# Patient Record
Sex: Male | Born: 1955
Health system: Southern US, Community
[De-identification: ages and names within clinical notes are randomized; demographics above are authoritative.]

## PROBLEM LIST (undated history)

## (undated) DIAGNOSIS — I10 Essential (primary) hypertension: Secondary | ICD-10-CM

## (undated) DIAGNOSIS — F419 Anxiety disorder, unspecified: Secondary | ICD-10-CM

## (undated) HISTORY — PX: FRACTURE SURGERY: SHX138

---

## 1999-06-15 ENCOUNTER — Emergency Department (HOSPITAL_COMMUNITY): Admission: EM | Admit: 1999-06-15 | Discharge: 1999-06-15 | Payer: Self-pay | Admitting: Emergency Medicine

## 2004-05-15 ENCOUNTER — Inpatient Hospital Stay (HOSPITAL_COMMUNITY): Admission: EM | Admit: 2004-05-15 | Discharge: 2004-05-19 | Payer: Self-pay | Admitting: Psychiatry

## 2004-05-21 ENCOUNTER — Emergency Department (HOSPITAL_COMMUNITY): Admission: EM | Admit: 2004-05-21 | Discharge: 2004-05-21 | Payer: Self-pay

## 2006-10-02 ENCOUNTER — Emergency Department (HOSPITAL_COMMUNITY): Admission: EM | Admit: 2006-10-02 | Discharge: 2006-10-03 | Payer: Self-pay | Admitting: Emergency Medicine

## 2015-03-16 ENCOUNTER — Telehealth (HOSPITAL_COMMUNITY): Payer: Self-pay

## 2017-02-17 NOTE — ED Provider Notes (Signed)
ED First Attending       History     Chief Complaint   Patient presents with    Medication Refill     Pt reports needing his HTN nad PSY meds. Pt does not know his meds. -SI/HI/CP/SOB.        History provided by:  patient  History limited by: nothing    HPI  60 HTN, EtOH abuse and homeless presenting w/ request for medication refill and shelter  Tried to go to shelter today, unable to get in 2/2 no beds  Homeless 1 week ago  Last drink tonight, never DTs, hosp or ICU  No other complaints including visual changes, SOB, chest pain, N/V, fevers, chills or any other concerns    Allergies/Contraindications  No Known Allergies    Previous Medications    No medications on file       No past medical history on file.    No past surgical history on file.    Social History     Social History    Marital status: N/A     Spouse name: N/A    Number of children: N/A    Years of education: N/A     Occupational History    Not on file.     Social History Main Topics    Smoking status: Not on file    Smokeless tobacco: Not on file    Alcohol use Not on file    Drug use: Unknown    Sexual activity: Not on file     Other Topics Concern    Not on file     Social History Narrative    No narrative on file         No family history on file.    History, Medications and Nursing Notes were reviewed by me  Review of Systems     Review of Systems   Constitutional: Negative for activity change, appetite change, chills, diaphoresis and fever.   HENT: Negative for congestion, rhinorrhea and trouble swallowing.    Eyes: Negative for photophobia, discharge and visual disturbance.   Respiratory: Negative for cough, choking, chest tightness, shortness of breath, wheezing and stridor.    Cardiovascular: Negative for chest pain and leg swelling.   Gastrointestinal: Negative for abdominal pain, anal bleeding, blood in stool, constipation, diarrhea, nausea and vomiting.   Endocrine: Negative for polyuria.   Genitourinary: Negative for  decreased urine volume, difficulty urinating, dysuria, flank pain, hematuria and urgency.   Skin: Negative for rash.   Neurological: Negative for dizziness, tremors, weakness, numbness and headaches.   Hematological: Negative.    Psychiatric/Behavioral: Negative.        Physical Exam   Triage Vital Signs:  BP: 152/76, Pulse - Palpated/Pleth: 89, Temp: 36.7 C (98.1 F), *Resp: 19, SpO2: 98 %    Physical Exam   Constitutional: He is oriented to person, place, and time. He appears well-developed and well-nourished. No distress.   HENT:   Head: Normocephalic.   Right Ear: External ear normal.   Left Ear: External ear normal.   Eyes: Conjunctivae and EOM are normal. Pupils are equal, round, and reactive to light. Right eye exhibits no discharge. Left eye exhibits no discharge. No scleral icterus.   Neck: Normal range of motion. Neck supple. No JVD present. No tracheal deviation present. No thyromegaly present.   Cardiovascular: Normal rate, regular rhythm, normal heart sounds and intact distal pulses.  Exam reveals no gallop and no friction rub.  No murmur heard.  Pulmonary/Chest: No stridor. No respiratory distress. He has no wheezes. He has no rales. He exhibits no tenderness.   Abdominal: Soft. Bowel sounds are normal. He exhibits no distension and no mass. There is no tenderness. There is no rebound and no guarding.   Musculoskeletal: Normal range of motion. He exhibits no edema.   Neurological: He is alert and oriented to person, place, and time. No cranial nerve deficit.   Skin: Skin is warm and dry. No rash noted. He is not diaphoretic. No erythema. No pallor.   Psychiatric: His behavior is normal.         Interpretations:  Lab, Imaging, EKG & Rhythm Strip       ED Course (Document differential diagnosis, ED treatment, response to treatment, reasons for choice of disposition, and whether further outpatient workup is needed or if patient is going to OR or ICU)      60 M27 pres for medication refill, food, and  shelter referral  No PCP, gets meds from emergency room, no acute symptoms  No signs of withdrawal     HOT referral, PCP coordination      Reassessment   12:56 AM  No space in shelter per HOT team      Coding and Billing Info     MDM               Estanislado Pandy, MD  Resident  02/18/17 1610       Charlies Constable, MD  02/18/17 1309

## 2018-02-20 ENCOUNTER — Emergency Department (HOSPITAL_COMMUNITY): Payer: Medicaid Other

## 2018-02-20 ENCOUNTER — Encounter (HOSPITAL_COMMUNITY): Payer: Self-pay | Admitting: *Deleted

## 2018-02-20 ENCOUNTER — Emergency Department (HOSPITAL_COMMUNITY)
Admission: EM | Admit: 2018-02-20 | Discharge: 2018-02-22 | Disposition: A | Discharge status: Home / Self Care | Payer: Medicaid Other | Attending: Emergency Medicine | Admitting: Emergency Medicine

## 2018-02-20 DIAGNOSIS — R45851 Suicidal ideations: Secondary | ICD-10-CM

## 2018-02-20 DIAGNOSIS — F331 Major depressive disorder, recurrent, moderate: Secondary | ICD-10-CM

## 2018-02-20 DIAGNOSIS — M79671 Pain in right foot: Secondary | ICD-10-CM | POA: Diagnosis present

## 2018-02-20 DIAGNOSIS — F17200 Nicotine dependence, unspecified, uncomplicated: Secondary | ICD-10-CM | POA: Insufficient documentation

## 2018-02-20 DIAGNOSIS — Z5900 Homelessness unspecified: Secondary | ICD-10-CM

## 2018-02-20 DIAGNOSIS — I10 Essential (primary) hypertension: Secondary | ICD-10-CM | POA: Diagnosis not present

## 2018-02-20 DIAGNOSIS — F419 Anxiety disorder, unspecified: Secondary | ICD-10-CM | POA: Diagnosis not present

## 2018-02-20 DIAGNOSIS — F17210 Nicotine dependence, cigarettes, uncomplicated: Secondary | ICD-10-CM | POA: Diagnosis not present

## 2018-02-20 DIAGNOSIS — F329 Major depressive disorder, single episode, unspecified: Secondary | ICD-10-CM | POA: Diagnosis not present

## 2018-02-20 DIAGNOSIS — Z01818 Encounter for other preprocedural examination: Secondary | ICD-10-CM

## 2018-02-20 DIAGNOSIS — M79673 Pain in unspecified foot: Secondary | ICD-10-CM | POA: Diagnosis not present

## 2018-02-20 HISTORY — DX: Essential (primary) hypertension: I10

## 2018-02-20 LAB — COMPREHENSIVE METABOLIC PANEL
ALBUMIN: 4.1 g/dL (ref 3.5–5.0)
ALT: 19 U/L (ref 17–63)
AST: 22 U/L (ref 15–41)
Alkaline Phosphatase: 77 U/L (ref 38–126)
Anion gap: 9 (ref 5–15)
BUN: 13 mg/dL (ref 6–20)
CO2: 26 mmol/L (ref 22–32)
CREATININE: 0.94 mg/dL (ref 0.61–1.24)
Calcium: 9.7 mg/dL (ref 8.9–10.3)
Chloride: 104 mmol/L (ref 101–111)
GFR calc Af Amer: >60 mL/min (ref >60)
GFR calc non Af Amer: >60 mL/min (ref >60)
GLUCOSE: 124 mg/dL — AB (ref 65–99)
Potassium: 4.5 mmol/L (ref 3.5–5.1)
SODIUM: 139 mmol/L (ref 135–145)
Total Bilirubin: 1 mg/dL (ref 0.3–1.2)
Total Protein: 8.2 g/dL — ABNORMAL HIGH (ref 6.5–8.1)

## 2018-02-20 LAB — CBC WITH DIFFERENTIAL/PLATELET
BASOS PCT: 0 %
Basophils Absolute: 0 10*3/uL (ref 0.0–0.1)
EOS PCT: 1 %
Eosinophils Absolute: 0.1 10*3/uL (ref 0.0–0.7)
HCT: 41 % (ref 39.0–52.0)
HEMOGLOBIN: 14.3 g/dL (ref 13.0–17.0)
Lymphocytes Relative: 25 %
Lymphs Abs: 2 10*3/uL (ref 0.7–4.0)
MCH: 32 pg (ref 26.0–34.0)
MCHC: 34.9 g/dL (ref 30.0–36.0)
MCV: 91.7 fL (ref 78.0–100.0)
Monocytes Absolute: 0.7 10*3/uL (ref 0.1–1.0)
Monocytes Relative: 9 %
NEUTROS PCT: 65 %
Neutro Abs: 5.1 10*3/uL (ref 1.7–7.7)
PLATELETS: 240 10*3/uL (ref 150–400)
RBC: 4.47 MIL/uL (ref 4.22–5.81)
RDW: 13.1 % (ref 11.5–15.5)
WBC: 7.9 10*3/uL (ref 4.0–10.5)

## 2018-02-20 LAB — RAPID URINE DRUG SCREEN, HOSP PERFORMED
AMPHETAMINES: NOT DETECTED
BARBITURATES: NOT DETECTED
BENZODIAZEPINES: NOT DETECTED
Cocaine: NOT DETECTED
Opiates: NOT DETECTED
TETRAHYDROCANNABINOL: NOT DETECTED

## 2018-02-20 LAB — ETHANOL: Alcohol, Ethyl (B): <10 mg/dL (ref <10)

## 2018-02-20 LAB — ACETAMINOPHEN LEVEL: Acetaminophen (Tylenol), Serum: <10 ug/mL — ABNORMAL LOW (ref 10–30)

## 2018-02-20 LAB — SALICYLATE LEVEL

## 2018-02-20 MED ORDER — LORAZEPAM 2 MG/ML IJ SOLN: 0.0000 mg | Freq: Two times a day (BID) | INTRAMUSCULAR | Status: DC

## 2018-02-20 MED ORDER — LORAZEPAM 1 MG PO TABS: 0.0000 mg | ORAL_TABLET | Freq: Four times a day (QID) | ORAL | Status: DC

## 2018-02-20 MED ORDER — IBUPROFEN 200 MG PO TABS
600.0000 mg | ORAL_TABLET | Freq: Three times a day (TID) | ORAL | Status: DC | PRN
  Administered 2018-02-21 – 2018-02-22 (×2): 600 mg via ORAL
  Filled 2018-02-20 (×2): qty 3

## 2018-02-20 MED ORDER — THIAMINE HCL 100 MG/ML IJ SOLN: 100.0000 mg | Freq: Every day | INTRAMUSCULAR | Status: DC

## 2018-02-20 MED ORDER — VITAMIN B-1 100 MG PO TABS
100.0000 mg | ORAL_TABLET | Freq: Every day | ORAL | Status: DC
  Administered 2018-02-20: 100 mg via ORAL
  Filled 2018-02-20: qty 1

## 2018-02-20 MED ORDER — LORAZEPAM 1 MG PO TABS: 0.0000 mg | ORAL_TABLET | Freq: Two times a day (BID) | ORAL | Status: DC

## 2018-02-20 MED ORDER — LORAZEPAM 2 MG/ML IJ SOLN: 0.0000 mg | Freq: Four times a day (QID) | INTRAMUSCULAR | Status: DC

## 2018-02-20 NOTE — ED Notes (Signed)
Bed: WA30 Expected date:  Expected time:  Means of arrival:  Comments: 

## 2018-02-20 NOTE — ED Notes (Signed)
Pt had the following items locked with security:  Wallet with ID cards and bank cards $14 cash Black cell phone (flip phone) Cell phone charger  Key is attached paper and place in pt's chart.

## 2018-02-20 NOTE — ED Notes (Signed)
Pt currently take a shower.

## 2018-02-20 NOTE — ED Provider Notes (Signed)
[facility]-EMERGENCY DEPT Provider Note   CSN: [id number] Arrival date & time: 02/20/18  1341     History   Chief Complaint Chief Complaint  Patient presents with  . Foot Pain  . Homeless    HPI Allen Travis is a 62 y.o. male who presents today for evaluation of foot pain and homelessness.  Patient is a poor historian.  He initially reported to me that his foot was operated on about 1 month ago after he stepped off a curb.  He then later told me that it was actually 3 months ago and that his foot has been hurting more for the past few days.  He reports that his surgery was done in [place], however he does not know where, which hospital, or which doctor.  He reports that he came from [place] to [place] approximately 3 days ago.  He reports that he is unable to walk on his foot and that he needs somewhere to stay as he cannot go back onto the streets tonight.  He denies any fevers or chills.  Denies any other physical complaints.  After I told patient that he would not be admitted for his foot pain and would be given a resource guide with shelters he then told me that he was suicidal and homicidal.  He does not have any specific plans for either of these.  When I asked him what diagnoses he has been given in the past he told me stress.    HPI  Past Medical History:  Diagnosis Date  . Hypertension     There are no active problems to display for this patient.   Past Surgical History:  Procedure Laterality Date  . FRACTURE SURGERY          Home Medications    Prior to Admission medications   Not on File    Family History No family history on file.  Social History Social History   Tobacco Use  . Smoking status: Current Some Day Smoker  . Smokeless tobacco: Never Used  Substance Use Topics  . Alcohol use: Yes  . Drug use: Not on file     Allergies   Patient has no allergy information on record.   Review of Systems Review of  Systems  Constitutional: Negative for chills and fever.  Skin: Negative for color change and wound.  Neurological: Negative for weakness.  Psychiatric/Behavioral: Positive for suicidal ideas.  All other systems reviewed and are negative.    Physical Exam Updated Vital Signs BP (!) 149/89 (BP Location: Right Arm)   Pulse 81   Temp 98.1 F (36.7 C) (Oral)   Resp 17   SpO2 96%   Physical Exam  Constitutional: Vital signs are normal. He does not appear ill. No distress.  Patient is disheveled, walking boot on right ankle is wet and malodorous, he has on multiple layers of socks all of which are also wet and malodorous.  He generally appears unkempt.  HENT:  Head: Normocephalic and atraumatic.  Eyes: Conjunctivae are normal. Right eye exhibits no discharge. Left eye exhibits no discharge. No scleral icterus.  Neck: Normal range of motion.  Cardiovascular: Normal rate and regular rhythm.  Pulmonary/Chest: Effort normal. No stridor. No respiratory distress.  Abdominal: He exhibits no distension.  Musculoskeletal: He exhibits no edema or deformity.  Right foot is warm and well perfused with intact sensory function.  Areas were patient reports his incision were appear very well-healed.  Toenails are dystrophic, however there  is no obvious signs of superficial infection.  Neurological: He is alert. He exhibits normal muscle tone.  Skin: Skin is warm and dry.  Psychiatric: He has a normal mood and affect. His behavior is normal. He expresses homicidal and suicidal ideation. He expresses no suicidal plans and no homicidal plans.  Nursing note and vitals reviewed.    ED Treatments / Results  Labs (all labs ordered are listed, but only abnormal results are displayed) Labs Reviewed  COMPREHENSIVE METABOLIC PANEL - Abnormal; Notable for the following components:      Result Value   Glucose, Bld 124 (*)    Total Protein 8.2 (*)    All other components within normal limits  ACETAMINOPHEN  LEVEL - Abnormal; Notable for the following components:   Acetaminophen (Tylenol), Serum <10 (*)    All other components within normal limits  ETHANOL  RAPID URINE DRUG SCREEN, HOSP PERFORMED  CBC WITH DIFFERENTIAL/PLATELET  SALICYLATE LEVEL    EKG None  Radiology Dg Tibia/fibula Right  Result Date: 02/20/2018 CLINICAL DATA:  Patient is unable to walk since breaking his foot 3 months ago. Patient is still unable to bear weight on the foot. Pain is mostly in the right foot and ankle. EXAM: RIGHT TIBIA AND FIBULA - 2 VIEW COMPARISON:  None. FINDINGS: Medial femorotibial joint space narrowing of the included knee. Lateral plate and screw fixation across distal fibular diaphyseal fracture with incomplete osseous union noted along the posterior aspect of the distal fibula. Two syndesmotic screws are in place across the tibiofibular syndesmosis. Soft tissue coarsened ossifications are present along the expected course of the Achilles. No fracture of the hardware nor definite loosening. Intact ankle mortise. Intact subtalar joint. Small ankle joint effusion. Calcaneal enthesopathy is identified along plantar dorsal aspect. IMPRESSION: 1. 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 2. Coarsened soft tissue calcifications along the course of the expected Achilles tendon likely reflecting remote injury to the Achilles. 3. No hardware failure. 4. Small ankle joint effusion. 5. Small plantar and dorsal calcaneal spurs. 6. No new fracture or frank bone destruction. 7. Osteoarthritis of the knee. Electronically Signed   By: Tollie Eth M.D.   On: 02/20/2018 18:45   Dg Foot Complete Right  Result Date: 02/20/2018 CLINICAL DATA:  Persistent right foot pain following a fracture 3 months ago. Unable to bear weight. Initial encounter. EXAM: RIGHT FOOT COMPLETE - 3+ VIEW COMPARISON:  None. FINDINGS: Sequelae of plate and screw fixation of a distal fibular fracture are partially  visualized. Screws also extend across the distal tibiofibular syndesmosis. There is heterotopic ossification posterior to the ankle. The bones of the foot are osteopenic without acute fracture or dislocation identified. Mild degenerative changes are noted in multiple IP joints. A small plantar calcaneal enthesophyte is present. There is mild soft tissue swelling along the dorsum of the foot. IMPRESSION: 1. No acute osseous abnormality identified in the foot. 2. Prior ORIF of a distal fibular fracture, incompletely evaluated. Electronically Signed   By: Sebastian Ache M.D.   On: 02/20/2018 18:01    Procedures Procedures (including critical care time)  Medications Ordered in ED Medications  LORazepam (ATIVAN) injection 0-4 mg (has no administration in time range)    Or  LORazepam (ATIVAN) tablet 0-4 mg (has no administration in time range)  LORazepam (ATIVAN) injection 0-4 mg (has no administration in time range)    Or  LORazepam (ATIVAN) tablet 0-4 mg (has no administration in time range)  thiamine (  VITAMIN B-1) tablet 100 mg (has no administration in time range)    Or  thiamine (B-1) injection 100 mg (has no administration in time range)  ibuprofen (ADVIL,MOTRIN) tablet 600 mg (has no administration in time range)     Initial Impression / Assessment and Plan / ED Course  I have reviewed the triage vital signs and the nursing notes.  Pertinent labs & imaging results that were available during my care of the patient were reviewed by me and considered in my medical decision making (see chart for details).  Clinical Course as of Feb 20 2026  Tue Feb 20, 2018  1620 Attempted to see patient, is in bathroom.    [EH]  1850 Was informed by x-ray that patient would like to speak with mental health regarding his depression.  I asked the patient what he has been diagnosed with in the past and he reports stress.  He denies any other medical or psychiatric diagnoses.  He states that he gets [employer]  Security payments.  Patient reports that he is suicidal, does not have a plan, when I asked him if he sees things that other people do not see he reports "yes I have all of that."  He reports generally being homicidal.  When I asked patient where he would be going tonight if he is not here overnight he reports that he would most likely be going back to the streets as he does not have anywhere to go tonight.  Reports that he is out of his psychiatric medications, however is unable to tell me what they are.   [EH]  2019 Patient medically clear   [EH]    Clinical Course User Index [EH] Cristina GongHammond, Octavia Velador W, PA-C   Patient presents today for evaluation of right foot pain.  He reports that he came here from [place] about 3 days ago and has been homeless since then.  He is a poor historian and changes his story about when his surgery happened, does not know where it happened or who performed his surgery.  He denies any past medical history other than stress.  X-rays were obtained and reviewed.  Studies do not show evidence of hardware failure, they do show evidence of a distal fibular nonunion.  Patient can be weightbearing on this in a boot.  Out patient ortho follow up.  When I informed patient that he would be discharged and given a list of shelters he then stated that he was suicidal and homicidal.  He does not have specific plans for either of these.  TTS was consulted.  Medical clearance labs were obtained no acute abnormalities.  Patient is medically clear for psychiatric placement and disposition.  Final Clinical Impressions(s) / ED Diagnoses   Final diagnoses:  Homeless  Suicidal ideation  Right foot pain    ED Discharge Orders    None       Norman ClayHammond, Sherlon Nied W, PA-C 02/20/18 2028    Jacalyn LefevreHaviland, Julie, MD 02/20/18 2200

## 2018-02-20 NOTE — ED Triage Notes (Signed)
Pt states he is unable to walk since breaking his foot 3 months ago. Pt had surgery and went to physical therapy but still cannot bear weight on his foot. Pt states he is homeless and does not have any resources to help take care of himself.

## 2018-02-20 NOTE — BH Assessment (Addendum)
Assessment Note  Allen Travis is an 62 y.o. male.  The pt came in due to medical reasons and when he was about to be discharged he told an RN he was suicidal, homicidal and was hearing and seeing things.  The pt now says he is not homicidal.  He stated he has passing suicidal thoughts and stated he last had suicidal thoughts "a few hours ago".  He denies having had a plan.  He reports he has had about 10 attempts in the past.  The pt stated he never overdosed on medication and was not able to state what he normally did to try to kill himself in the past.  He stated he was hearing voices earlier today saying to "jump on people".  He denies any visual hallucinations.  The pt is not a good historian and was not able to answer many questions such as his last hospitalizations, previous medications, or most recent counselors or psychiatrist.  The pt mentioned 7 members of his family died in a house fire in [id number].  He stated he still thinks about his deceased family members.  The pt reported he is not sleeping or eating well.  He denied SA and his UDS is negative for all substances.    Diagnosis: F25.1 Schizoaffective disorder, Depressive type   Past Medical History:  Past Medical History:  Diagnosis Date  . Hypertension     Past Surgical History:  Procedure Laterality Date  . FRACTURE SURGERY      Family History: No family history on file.  Social History:  reports that he has been smoking.  He has never used smokeless tobacco. He reports that he drinks alcohol. His drug history is not on file.  Additional Social History:  Alcohol / Drug Use Pain Medications: See MAR Prescriptions: See MAR Over the Counter: See MAr History of alcohol / drug use?: No history of alcohol / drug abuse Longest period of sobriety (when/how long): NA  CIWA: CIWA-Ar BP: (!) 148/88 Pulse Rate: 94 Nausea and Vomiting: no nausea and no vomiting Tactile Disturbances: none Tremor: no tremor Auditory  Disturbances: very mild harshness or ability to frighten Paroxysmal Sweats: no sweat visible Visual Disturbances: not present Anxiety: no anxiety, at ease Headache, Fullness in Head: none present Agitation: normal activity Orientation and Clouding of Sensorium: oriented and can do serial additions CIWA-Ar Total: 1 COWS:    Allergies: No Known Allergies  Home Medications:  (Not in a hospital admission)  OB/GYN Status:  No LMP for male patient.  General Assessment Data Location of Assessment: [facility] TTS Assessment: In system Is this a Tele or Face-to-Face Assessment?: Face-to-Face Is this an Initial Assessment or a Re-assessment for this encounter?: Initial Assessment Marital status: Single Maiden name: NA Is patient pregnant?: Other (Comment)(Male) Living Arrangements: Alone, Other (Comment)(homeless) Can pt return to current living arrangement?: Yes Admission Status: Voluntary Is patient capable of signing voluntary admission?: Yes Referral Source: Self/Family/Friend Insurance type: Medicaid     Crisis Care Plan Living Arrangements: Alone, Other (Comment)(homeless) Legal Guardian: Other:(Self) Name of Psychiatrist: none Name of Therapist: none  Education Status Is patient currently in school?: No Is the patient employed, unemployed or receiving disability?: Unemployed  Risk to self with the past 6 months Suicidal Ideation: No-Not Currently/Within Last 6 Months Has patient been a risk to self within the past 6 months prior to admission? : No Suicidal Intent: No-Not Currently/Within Last 6 Months Has patient had any suicidal intent within the past 6 months prior  to admission? : No Is patient at risk for suicide?: Yes Suicidal Plan?: No Has patient had any suicidal plan within the past 6 months prior to admission? : No Access to Means: No What has been your use of drugs/alcohol within the last 12 months?: none Previous Attempts/Gestures: Yes How many times?:  10 Other Self Harm Risks: cutting Triggers for Past Attempts: Unpredictable Intentional Self Injurious Behavior: Cutting Comment - Self Injurious Behavior: history of cutting Family Suicide History: No Recent stressful life event(s): Other (Comment), Recent negative physical changes(homeless and broke foot) Persecutory voices/beliefs?: No Depression: Yes Depression Symptoms: Feeling worthless/self pity Substance abuse history and/or treatment for substance abuse?: No Suicide prevention information given to non-admitted patients: Yes  Risk to Others within the past 6 months Homicidal Ideation: No Does patient have any lifetime risk of violence toward others beyond the six months prior to admission? : No Thoughts of Harm to Others: No Current Homicidal Intent: No Current Homicidal Plan: No Access to Homicidal Means: No Identified Victim: none History of harm to others?: No Assessment of Violence: None Noted Violent Behavior Description: none Does patient have access to weapons?: No Criminal Charges Pending?: No Does patient have a court date: No Is patient on probation?: No  Psychosis Hallucinations: Auditory, Visual Delusions: None noted  Mental Status Report Appearance/Hygiene: In scrubs, Unremarkable Eye Contact: Good Motor Activity: Unable to assess Speech: Logical/coherent Level of Consciousness: Alert Mood: Pleasant Affect: Appropriate to circumstance Anxiety Level: None Thought Processes: Coherent, Relevant Judgement: Partial Orientation: Person, Place, Time, Situation, Appropriate for developmental age Obsessive Compulsive Thoughts/Behaviors: None  Cognitive Functioning Concentration: Normal Memory: Recent Intact, Remote Intact Is patient IDD: No Is patient DD?: No Insight: Fair Impulse Control: Fair Appetite: Good Have you had any weight changes? : Loss Amount of the weight change? (lbs): 0 lbs Sleep: Decreased Total Hours of Sleep: 5 Vegetative  Symptoms: None  ADLScreening [facility] Assessment Services) Patient's cognitive ability adequate to safely complete daily activities?: Yes Patient able to express need for assistance with ADLs?: Yes Independently performs ADLs?: Yes (appropriate for developmental age)  Prior Inpatient Therapy Prior Inpatient Therapy: Yes Prior Therapy Dates: multiple, most recent 2012 Prior Therapy Facilty/Provider(s): "place in [place]" Reason for Treatment: SI  Prior Outpatient Therapy Prior Outpatient Therapy: Yes Prior Therapy Dates: 2018 Prior Therapy Facilty/Provider(s): pt doesn't remember Reason for Treatment: depression Does patient have an ACCT team?: No Does patient have Intensive In-House Services?  : No Does patient have [facility] services? : No Does patient have P4CC services?: No  ADL Screening (condition at time of admission) Patient's cognitive ability adequate to safely complete daily activities?: Yes Patient able to express need for assistance with ADLs?: Yes Independently performs ADLs?: Yes (appropriate for developmental age)       Abuse/Neglect Assessment (Assessment to be complete while patient is alone) Abuse/Neglect Assessment Can Be Completed: Yes Physical Abuse: Denies Verbal Abuse: Denies Sexual Abuse: Denies Exploitation of patient/patient's resources: Denies Self-Neglect: Denies Values / Beliefs Cultural Requests During Hospitalization: None Spiritual Requests During Hospitalization: None Consults Spiritual Care Consult Needed: No Social Work Consult Needed: No            Disposition:  Disposition Initial Assessment Completed for this Encounter: Yes   PA Donell Sievert recommends the pt be observed overnight for safety and stabilization and then reassessed in the AM.  RN Christeen Douglas was made aware of the recommendation.  On Site Evaluation by:   Reviewed with Physician:    Ottis Stain 02/20/2018 11:07 PM

## 2018-02-21 ENCOUNTER — Emergency Department (HOSPITAL_COMMUNITY): Payer: Medicaid Other

## 2018-02-21 DIAGNOSIS — Z736 Limitation of activities due to disability: Secondary | ICD-10-CM

## 2018-02-21 DIAGNOSIS — R440 Auditory hallucinations: Secondary | ICD-10-CM

## 2018-02-21 DIAGNOSIS — R45851 Suicidal ideations: Secondary | ICD-10-CM | POA: Diagnosis not present

## 2018-02-21 DIAGNOSIS — F17200 Nicotine dependence, unspecified, uncomplicated: Secondary | ICD-10-CM

## 2018-02-21 DIAGNOSIS — F329 Major depressive disorder, single episode, unspecified: Secondary | ICD-10-CM | POA: Diagnosis not present

## 2018-02-21 DIAGNOSIS — F419 Anxiety disorder, unspecified: Secondary | ICD-10-CM | POA: Diagnosis not present

## 2018-02-21 DIAGNOSIS — Z590 Homelessness: Secondary | ICD-10-CM

## 2018-02-21 DIAGNOSIS — R4587 Impulsiveness: Secondary | ICD-10-CM

## 2018-02-21 DIAGNOSIS — M79673 Pain in unspecified foot: Secondary | ICD-10-CM

## 2018-02-21 LAB — URINALYSIS, ROUTINE W REFLEX MICROSCOPIC
BILIRUBIN URINE: NEGATIVE
Bacteria, UA: NONE SEEN
GLUCOSE, UA: NEGATIVE mg/dL
KETONES UR: NEGATIVE mg/dL
LEUKOCYTES UA: NEGATIVE
NITRITE: NEGATIVE
PROTEIN: NEGATIVE mg/dL
Specific Gravity, Urine: 1.012 (ref 1.005–1.030)
pH: 5 (ref 5.0–8.0)

## 2018-02-21 NOTE — ED Notes (Signed)
Psych Provider at bedside. 

## 2018-02-21 NOTE — Care Management Note (Signed)
Case Management Note  CM contacted for no pcp and homelessness with need for follow up.  CM advised Allen Travis, [place] [facility]avinia SharpsMary Ann Placey, NP.  She can assist him with establishing her as his pcp, medications, specialty follow up, mental health resources, and homelessness resources.  CM will send a note to Chales AbrahamsMary Ann to be aware of pt's needs.  No further CM needs noted at this time.

## 2018-02-21 NOTE — Evaluation (Addendum)
Physical Therapy Evaluation Patient Details Name: Allen Travis MRN: [id number] DOB: 1956/01/29 Today's Date: 02/21/2018   History of Present Illness  62 yo male admitted with suicidal ideation, R foot pain. Hx of R foot surgery 2*fx per pt ~1 month prior to admission  Clinical Impression  On eval, pt was supervision level assist with mobility. He walked ~75 feet with a RW. Pain rated 8/10 with activity. At baseline, pt is able to ambulate with a straight cane with Modified Independence. Unsure of d/c plan at this time. Pt stated he is homeless (living in [place] since moving from [place]). Will continue to follow, progress activity, and assess DME needs. Unsure of last time pt followed up with orthopedic MD since surgery.     Follow Up Recommendations SNF (vs inpatient psych hospital-per chart)    Equipment Recommendations  (continuing to assess)    Recommendations for Other Services       Precautions / Restrictions Precautions Precautions: Fall Required Braces or Orthoses: Other Brace/Splint Other Brace/Splint: CAM boot Restrictions Other Position/Activity Restrictions: per pt, he is WBAT with CAM boot      Mobility  Bed Mobility Overal bed mobility: Modified Independent                Transfers Overall transfer level: Needs assistance Equipment used: Rolling walker (2 wheeled) Transfers: Sit to/from Stand Sit to Stand: Supervision         General transfer comment: for safety.   Ambulation/Gait Ambulation/Gait assistance: Supervision Ambulation Distance (Feet): 75 Feet Assistive device: Rolling walker (2 wheeled) Gait Pattern/deviations: Step-to pattern;Step-through pattern;Decreased stride length     General Gait Details: for safety. No LOB with RW use.   Stairs            Wheelchair Mobility    Modified Rankin (Stroke Patients Only)       Balance Overall balance assessment: Needs assistance           Standing balance-Leahy Scale:  Fair Standing balance comment: pt able to statically stand without LOB                             Pertinent Vitals/Pain Pain Assessment: 0-10 Pain Score: 8  Pain Location: R foot with activity Pain Descriptors / Indicators: Sore Pain Intervention(s): Monitored during session;Repositioned    Home Living Family/patient expects to be discharged to:: Shelter/Homeless                 Additional Comments: pt stated he has been living in motels since moving from [place]    Prior Function Level of Independence: Independent with assistive device(s)         Comments: ambulatory with Cam boot and cane. Pt has lost his cane     Hand Dominance        Extremity/Trunk Assessment   Upper Extremity Assessment Upper Extremity Assessment: Overall WFL for tasks assessed    Lower Extremity Assessment Lower Extremity Assessment: Generalized weakness    Cervical / Trunk Assessment Cervical / Trunk Assessment: Normal  Communication   Communication: No difficulties  Cognition Arousal/Alertness: Awake/alert Behavior During Therapy: WFL for tasks assessed/performed Overall Cognitive Status: Within Functional Limits for tasks assessed                                        General Comments  Exercises     Assessment/Plan    PT Assessment Patient needs continued PT services  PT Problem List Decreased mobility;Pain;Decreased knowledge of use of DME       PT Treatment Interventions Gait training;Therapeutic activities;Therapeutic exercise;Patient/family education;Balance training;Functional mobility training;DME instruction    PT Goals (Current goals can be found in the Care Plan section)  Acute Rehab PT Goals Patient Stated Goal: regain independence with mobility PT Goal Formulation: With patient Time For Goal Achievement: 03/07/18 Potential to Achieve Goals: Good    Frequency Min 2X/week   Barriers to discharge         Co-evaluation               AM-PAC PT "6 Clicks" Daily Activity  Outcome Measure Difficulty turning over in bed (including adjusting bedclothes, sheets and blankets)?: None Difficulty moving from lying on back to sitting on the side of the bed? : None Difficulty sitting down on and standing up from a chair with arms (e.g., wheelchair, bedside commode, etc,.)?: None Help needed moving to and from a bed to chair (including a wheelchair)?: A Little Help needed walking in hospital room?: A Little Help needed climbing 3-5 steps with a railing? : A Little 6 Click Score: 21    End of Session Equipment Utilized During Treatment: Gait belt;Other (comment)(CAM boot) Activity Tolerance: Patient tolerated treatment well Patient left: in bed;with call bell/phone within reach   PT Visit Diagnosis: Other abnormalities of gait and mobility (R26.89);Pain Pain - Right/Left: Right Pain - part of body: Ankle and joints of foot    Time: [id number] PT Time Calculation (min) (ACUTE ONLY): 11 min   Charges:   PT Evaluation $PT Eval Moderate Complexity: 1 Mod     PT G Codes:          Allen Travis, MPT Pager: [phone]

## 2018-02-21 NOTE — BH Assessment (Signed)
[facility]  Per Juanetta BeetsJacqueline Norman, DO, this pt requires psychiatric hospitalization at this time.  The following facilities have been contacted to seek placement for this pt, with results as noted:  Beds available, information sent, decision pending:  [facility]   At capacity:  [facility]   Kateri Balch, [place] Behavioral Health Coordinator [phone]
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
## 2018-02-22 ENCOUNTER — Other Ambulatory Visit: Payer: Self-pay

## 2018-02-22 ENCOUNTER — Inpatient Hospital Stay
Admission: AD | Admit: 2018-02-22 | Discharge: 2018-03-01 | DRG: 885 | Disposition: A | Discharge status: Home / Self Care | Payer: BLUE CROSS/BLUE SHIELD | Source: Intra-hospital | Attending: Psychiatry | Admitting: Psychiatry

## 2018-02-22 ENCOUNTER — Encounter: Payer: Self-pay | Admitting: Psychiatry

## 2018-02-22 DIAGNOSIS — M79671 Pain in right foot: Secondary | ICD-10-CM | POA: Diagnosis not present

## 2018-02-22 DIAGNOSIS — Z716 Tobacco abuse counseling: Secondary | ICD-10-CM | POA: Diagnosis not present

## 2018-02-22 DIAGNOSIS — F332 Major depressive disorder, recurrent severe without psychotic features: Principal | ICD-10-CM | POA: Diagnosis present

## 2018-02-22 DIAGNOSIS — G4700 Insomnia, unspecified: Secondary | ICD-10-CM | POA: Diagnosis present

## 2018-02-22 DIAGNOSIS — Z590 Homelessness: Secondary | ICD-10-CM | POA: Diagnosis not present

## 2018-02-22 DIAGNOSIS — F1020 Alcohol dependence, uncomplicated: Secondary | ICD-10-CM | POA: Diagnosis present

## 2018-02-22 DIAGNOSIS — F17210 Nicotine dependence, cigarettes, uncomplicated: Secondary | ICD-10-CM

## 2018-02-22 DIAGNOSIS — Z818 Family history of other mental and behavioral disorders: Secondary | ICD-10-CM | POA: Diagnosis not present

## 2018-02-22 DIAGNOSIS — F1290 Cannabis use, unspecified, uncomplicated: Secondary | ICD-10-CM | POA: Diagnosis present

## 2018-02-22 DIAGNOSIS — Z9119 Patient's noncompliance with other medical treatment and regimen: Secondary | ICD-10-CM | POA: Diagnosis not present

## 2018-02-22 DIAGNOSIS — F4310 Post-traumatic stress disorder, unspecified: Secondary | ICD-10-CM | POA: Diagnosis present

## 2018-02-22 DIAGNOSIS — Z915 Personal history of self-harm: Secondary | ICD-10-CM

## 2018-02-22 DIAGNOSIS — S82892S Other fracture of left lower leg, sequela: Secondary | ICD-10-CM

## 2018-02-22 DIAGNOSIS — F331 Major depressive disorder, recurrent, moderate: Secondary | ICD-10-CM | POA: Diagnosis not present

## 2018-02-22 DIAGNOSIS — I10 Essential (primary) hypertension: Secondary | ICD-10-CM | POA: Diagnosis present

## 2018-02-22 DIAGNOSIS — R45851 Suicidal ideations: Secondary | ICD-10-CM | POA: Diagnosis present

## 2018-02-22 MED ORDER — ALUM & MAG HYDROXIDE-SIMETH 200-200-20 MG/5ML PO SUSP: 30.0000 mL | ORAL | Status: DC | PRN

## 2018-02-22 MED ORDER — ACETAMINOPHEN 325 MG PO TABS
650.0000 mg | ORAL_TABLET | Freq: Four times a day (QID) | ORAL | Status: DC | PRN
  Administered 2018-02-24 – 2018-02-28 (×3): 650 mg via ORAL
  Filled 2018-02-22 (×3): qty 2

## 2018-02-22 MED ORDER — ACETAMINOPHEN 325 MG PO TABS: 650.0000 mg | ORAL_TABLET | Freq: Four times a day (QID) | ORAL | Status: DC | PRN

## 2018-02-22 MED ORDER — MAGNESIUM HYDROXIDE 400 MG/5ML PO SUSP: 30.0000 mL | Freq: Every day | ORAL | Status: DC | PRN

## 2018-02-22 MED ORDER — HYDROXYZINE HCL 50 MG PO TABS: 50.0000 mg | ORAL_TABLET | Freq: Three times a day (TID) | ORAL | Status: DC | PRN

## 2018-02-22 MED ORDER — CITALOPRAM HYDROBROMIDE 20 MG PO TABS
10.0000 mg | ORAL_TABLET | Freq: Every day | ORAL | Status: DC
  Administered 2018-02-23 – 2018-02-24 (×2): 10 mg via ORAL
  Filled 2018-02-22 (×2): qty 1

## 2018-02-22 MED ORDER — IBUPROFEN 600 MG PO TABS: 600.0000 mg | ORAL_TABLET | Freq: Three times a day (TID) | ORAL | Status: DC | PRN

## 2018-02-22 MED ORDER — CITALOPRAM HYDROBROMIDE 10 MG PO TABS: 10.0000 mg | ORAL_TABLET | Freq: Every day | ORAL | Status: DC

## 2018-02-22 MED ORDER — IBUPROFEN 600 MG PO TABS
600.0000 mg | ORAL_TABLET | Freq: Four times a day (QID) | ORAL | Status: DC | PRN
  Administered 2018-02-22 – 2018-02-27 (×4): 600 mg via ORAL
  Filled 2018-02-22 (×4): qty 1

## 2018-02-22 MED ORDER — TRAZODONE HCL 100 MG PO TABS
100.0000 mg | ORAL_TABLET | Freq: Every evening | ORAL | Status: DC | PRN
  Administered 2018-02-26 – 2018-02-28 (×2): 100 mg via ORAL
  Filled 2018-02-22 (×4): qty 1

## 2018-02-22 NOTE — Tx Team (Signed)
Initial Treatment Plan 02/22/2018 6:49 PM Arta Silenceharles Heffler WUJ:[id number]    PATIENT STRESSORS: Financial difficulties Loss of home Medication change or noncompliance Substance abuse Other: No support system   PATIENT STRENGTHS: Capable of independent living Communication skills Motivation for treatment/growth   PATIENT IDENTIFIED PROBLEMS: Noncompliant with medications  Homeless  No support system  Social problems  Depression and anxious             DISCHARGE CRITERIA:  Ability to meet basic life and health needs Adequate post-discharge living arrangements Improved stabilization in mood, thinking, and/or behavior Motivation to continue treatment in a less acute level of care  PRELIMINARY DISCHARGE PLAN: Attend 12-step recovery group Placement in alternative living arrangements  PATIENT/FAMILY INVOLVEMENT: This treatment plan has been presented to and reviewed with the patient, Arta SilenceCharles Fulwider.  The patient hasbeen given the opportunity to ask questions and make suggestions.  Rex KrasJoanne  Versie Fleener, RN 02/22/2018, 6:49 PM

## 2018-02-22 NOTE — BH Assessment (Addendum)
[facility]  Accepting physician is Dr. Jennet MaduroPucilowska.  Attending Physician will be Dr. Jennet MaduroPucilowska.  Patient has been assigned to room 304, [facility] Charge Nurse [place].  Call report to [phone].  Representative/Transfer Coordinator is Warden/rangerCalvin Patient pre-ad[facility] Patient Access Mertie Clause(Jeanelle)  [facility] Staff Haig Prophet(Jamie L., NP) made aware of acceptance.

## 2018-02-22 NOTE — Progress Notes (Signed)
[facility], alert and oriented x4. He is in a wheelchair related to his broken right foot 2 months ago. He stated his chief compliant is depression with passive SI without a plan. He has been noncompliant with his medications. He has been drinking alcohol daily with his last drink 5 days ago. He was taken to the interview room for vital signs and his clothes being check to help calm him down.

## 2018-02-22 NOTE — BHH Suicide Risk Assessment (Signed)
[facility]   Nursing information obtained from:    Demographic factors:    Current Mental Status:    Loss Factors:    Historical Factors:    Risk Reduction Factors:     Total Time spent with patient: 1 hour Principal Problem: Severe episode of recurrent major depressive disorder, without psychotic features (HCC) Diagnosis:   Patient Active Problem List   Diagnosis Date Noted  . 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 Chronic pain syndrome [G89.4] 02/22/2018  . Major depressive disorder, recurrent severe without psychotic features (HCC) [F33.2] 02/22/2018  . Suicidal ideations [R45.851] 02/21/2018   Subjective Data: suicidal ideation.  Continued Clinical Symptoms:  Alcohol Use Disorder Identification Test Final Score (AUDIT): 32 The "Alcohol Use Disorders Identification Test", Guidelines for Use in Primary Care, Second Edition.  World Science writerHealth Organization [facility](WHO). Score between 0-7:  no or low risk or alcohol related problems. Score between 8-15:  moderate risk of alcohol related problems. Score between 16-19:  high risk of alcohol related problems. Score 20 or above:  warrants further diagnostic evaluation for alcohol dependence and treatment.   CLINICAL FACTORS:   Depression:   Comorbid alcohol abuse/dependence Impulsivity Alcohol/Substance Abuse/Dependencies Chronic Pain Medical Diagnoses and Treatments/Surgeries   Musculoskeletal: Strength & Muscle Tone: within normal limits Gait & Station: unable to stand, uses a wheelchair to protect L foot Patient leans: N/A  Psychiatric Specialty Exam: Physical Exam  Nursing note and vitals reviewed. Psychiatric: His speech is normal and behavior is normal. Cognition and memory are normal. He expresses impulsivity. He exhibits a depressed mood. He expresses suicidal ideation. He expresses suicidal plans.    Review of  Systems  Musculoskeletal: Positive for joint pain.  Neurological: Negative.   Psychiatric/Behavioral: Positive for depression, substance abuse and suicidal ideas.  All other systems reviewed and are negative.   Blood pressure 139/83, pulse 83, temperature 97.8 F (36.6 C), temperature source Oral, resp. rate 20, height 6\' 7"  (2.007 m), weight 103 kg (227 lb), SpO2 98 %.Body mass index is 25.57 kg/m.  General Appearance: Casual  Eye Contact:  Good  Speech:  Clear and Coherent  Volume:  Normal  Mood:  Depressed  Affect:  Flat  Thought Process:  Goal Directed and Descriptions of Associations: Intact  Orientation:  Full (Time, Place, and Person)  Thought Content:  WDL  Suicidal Thoughts:  Yes.  with intent/plan  Homicidal Thoughts:  No  Memory:  Immediate;   Fair Recent;   Fair Remote;   Fair  Judgement:  Poor  Insight:  Lacking  Psychomotor Activity:  Psychomotor Retardation  Concentration:  Concentration: Fair and Attention Span: Fair  Recall:  Fis[employer]Fair  Fund of Knowledge:  Fair  Language:  Fair  Akathisia:  No  Handed:  Right  AIMS (if indicated):     Assets:  Communication Skills Desire for Improvement Financial Resources/Insurance Resilience  ADL's:  Intact  Cognition:  WNL  Sleep:         COGNITIVE FEATURES THAT CONTRIBUTE TO RISK:  None    SUICIDE RISK:   Moderate:  Frequent suicidal ideation with limited intensity, and duration, some specificity in terms of plans, no associated intent, good self-control, limited dysphoria/symptomatology, some risk factors present, and identifiable protective factors, including available and accessible social support.  PLAN OF CARE: hospital admission, medication management, substance abuse counseling, discharge planning.  Allen Travis is a 62 year old male with a history  of depression admitted for suicidal ideation with a plan to cut himself in the context of severe social stressors and new medical problems. He uses a wheel chair to  "protect his leg and let it heal". It is unclear if this is recommended.   #Suicidal ideation, still suicidal -patient is able to contract for safety in the hospital  #Mood, severely depressed -he was started on Celexa 20 mg daily in the ER  #Insomnia, improved on current medication -Trazodone 100 mg  #Anxiety, still a problem -Vistaril 50 mg TID PRN  #Foot pain, s/p ankle surgery 2 months ago in [place] -uses wheelchair -Motrin 600 mg PRN -PT consult  #Alcohol abuse, episodic drinker, last drink 5 days ago -monitor for symptoms of withdrawal -minimizes problems and declines rehab  #Disposition -wishes to go to a boarding house in our area -follow up with [facility]  I certify that inpatient services furnished can reasonably be expected to improve the patient's condition.   Kristine Linea, MD 02/22/2018, 11:34 PM

## 2018-02-22 NOTE — Plan of Care (Addendum)
Patient just admitted to unit 45 minutes before my arrival. Patient is in the shower upon my arrival. Patient is visible and social throughout the evening. Complains of right leg pain, requests and is given Motrin with positive results. Reports anxiety and depression. Reports intermittent passive SI with no plan or intent. Patient is pleasant and cooperative throughout our interaction. Utilizing wheelchair for ambulation in place of crutches. Patient provided a sandwich tray of which he ate 100%. Remains on Fall precautions. Educated regarding fall precautions. Reports eating and voiding adequately. Denies need for Trazodone for sleep. Q 15 minute checks maintained. Will continue to monitor throughout the shift. Patient slept 6.75 hours. No apparent distress. Will endorse care to oncoming shift.  Problem: Education: Goal: Ability to make informed decisions regarding treatment will improve Outcome: Not Progressing   Problem: Coping: Goal: Coping ability will improve Outcome: Not Progressing   Problem: Health Behavior/Discharge Planning: Goal: Identification of resources available to assist in meeting health care needs will improve Outcome: Not Progressing   Problem: Medication: Goal: Compliance with prescribed medication regimen will improve Outcome: Not Progressing   Problem: Self-Concept: Goal: Ability to disclose and discuss suicidal ideas will improve Outcome: Not Progressing Goal: Will verbalize positive feelings about self Outcome: Not Progressing   Problem: Elimination: Goal: Will not experience complications related to bowel motility Outcome: Not Progressing   Problem: Pain Managment: Goal: General experience of comfort will improve Outcome: Not Progressing

## 2018-02-22 NOTE — Progress Notes (Signed)
CSW received a call from [facility] requesting pt's need for a bed.  CSW informed Victorino DikeJennifer at [facility] the pt has already been accepted elsewhere.  CSW will continue to follow for D/C needs.  Dorothe PeaJonathan F. Deziyah Arvin, LCSW, LCAS, CSI Clinical Social Worker Ph: [phone]

## 2018-02-22 NOTE — BHH Group Notes (Signed)
BHH Group Notes:  (Nursing/MHT/Case Management/Adjunct)  Date:  02/22/2018  Time:  8:56 PM  Type of Therapy:  Group Therapy  Participation Level:  Active  Participation Quality:  Appropriate  Affect:  Appropriate  Cognitive:  Alert  Insight:  Good  Engagement in Group:  Engaged  Modes of Intervention:  Support  Summary of Progress/Problems:  Allen Travis 02/22/2018, 8:56 PM

## 2018-02-22 NOTE — BH Assessment (Signed)
[facility]  Per Juanetta BeetsJacqueline Norman, DO, this pt continues to require psychiatric hospitalization at this time.  The following facilities have been contacted to seek placement for this pt, with results as noted:  Beds available, information sent, decision pending:  [facility]   At capacity:  [facility] (no [facility]) [facility]   Omir Cooprider, [place] Coordinator [phone]

## 2018-02-22 NOTE — BH Assessment (Signed)
Writer spoke with [facility] Allen Fus([facility] about the patient. He was unable to give the specifics about the patient's ability to walk and foot injury.  Writer spoke with patient's nurse and she stated the patient is able to walk with a walker. As far at the foot surgery their are no open wounds. "He just broke it."

## 2018-02-22 NOTE — BH Assessment (Signed)
[facility]  Per Juanetta BeetsJacqueline Norman, DO, this pt requires psychiatric hospitalization.  Robinette Hainesalvin Manning, Counselor, reports that pt [facility] by Dr Jennet MaduroPucilowska to Rm 304.  Pt has signed Voluntary Admission and Consent for Treatment, as well as Consent to Release Information to no one, and signed forms have been faxed to [phone].  Pt's nurse, Donnal DebarRandi, has been notified, and agrees to call report to [phone].  [facility].   Garland Smouse, MA Behavioral Health Coordinator [phone]

## 2018-02-22 NOTE — Consult Note (Addendum)
[place] Psychiatry Consult   Reason for Consult:  Suicidal ideations with a plan Referring Physician:  EDP Patient Identification: Allen Allen MRN:  [id number] Principal Diagnosis: Major depressive disorder, recurrent episode, moderate ([place]) Diagnosis:   Patient Active Problem List   Diagnosis Date Noted  . Major depressive disorder, recurrent episode, moderate ([place]) [F33.1] 02/22/2018    Priority: High  . Suicidal ideations [R45.851] 02/21/2018    Total Time spent with patient: 30 minutes  Subjective:   Allen Allen is a 62 y.o. male patient admitted with suicide plan.  HPI:  62 yo male who presented to the ED with suicidal ideations and plan to cut himself.  Today, he continues to endorse suicidal thoughts with plan along with feelings of hopelessness, helplessness, and worthlessness.  Homelessness is a big issue for him especially with his injured foot.  Past Psychiatric History: depression  Risk to Self: Yes Risk to Others: Homicidal Ideation: No Thoughts of Harm to Others: No Current Homicidal Intent: No Current Homicidal Plan: No Access to Homicidal Means: No Identified Victim: none History of harm to others?: No Assessment of Violence: None Noted Violent Behavior Description: none Does patient have access to weapons?: No Criminal Charges Pending?: No Does patient have a court date: No Prior Inpatient Therapy: Prior Inpatient Therapy: Yes Prior Therapy Dates: multiple, most recent 2012 Prior Therapy Facilty/Provider(s): "place in [place]" Reason for Treatment: SI Prior Outpatient Therapy: Prior Outpatient Therapy: Yes Prior Therapy Dates: 2018 Prior Therapy Facilty/Provider(s): pt doesn't remember Reason for Treatment: depression Does patient have an ACCT team?: No Does patient have Intensive In-House Services?  : No Does patient have [facility] services? : No Does patient have P4CC services?: No  Past Medical History:  Past Medical History:   Diagnosis Date  . Hypertension     Past Surgical History:  Procedure Laterality Date  . FRACTURE SURGERY     Family History: No family history on file. Family Psychiatric  History: none Social History:  Social History   Substance and Sexual Activity  Alcohol Use Yes     Social History   Substance and Sexual Activity  Drug Use Not on file    Social History   Socioeconomic History  . Marital status: Single    Spouse name: Not on file  . Number of children: Not on file  . Years of education: Not on file  . Highest education level: Not on file  Occupational History  . Not on file  Social Needs  . Financial resource strain: Not on file  . Food insecurity:    Worry: Not on file    Inability: Not on file  . Transportation needs:    Medical: Not on file    Non-medical: Not on file  Tobacco Use  . Smoking status: Current Some Day Smoker  . Smokeless tobacco: Never Used  Substance and Sexual Activity  . Alcohol use: Yes  . Drug use: Not on file  . Sexual activity: Not on file  Lifestyle  . Physical activity:    Days per week: Not on file    Minutes per session: Not on file  . Stress: Not on file  Relationships  . Social connections:    Talks on phone: Not on file    Gets together: Not on file    Attends religious service: Not on file    Active member of club or organization: Not on file    Attends meetings of clubs or organizations: Not on file  Relationship status: Not on file  Other Topics Concern  . Not on file  Social History Narrative  . Not on file   Additional Social History: N/A    Allergies:  No Known Allergies  Labs:  Results for orders placed or performed during the hospital encounter of 02/20/18 (from the past 48 hour(s))  Comprehensive metabolic panel     Status: Abnormal   Collection Time: 02/20/18  7:41 PM  Result Value Ref Range   Sodium 139 135 - 145 mmol/L   Potassium 4.5 3.5 - 5.1 mmol/L   Chloride 104 101 - 111 mmol/L   CO2 26  22 - 32 mmol/L   Glucose, Bld 124 (H) 65 - 99 mg/dL   BUN 13 6 - 20 mg/dL   Creatinine, Ser 0.94 0.61 - 1.24 mg/dL   Calcium 9.7 8.9 - 10.3 mg/dL   Total Protein 8.2 (H) 6.5 - 8.1 g/dL   Albumin 4.1 3.5 - 5.0 g/dL   AST 22 15 - 41 U/L   ALT 19 17 - 63 U/L   Alkaline Phosphatase 77 38 - 126 U/L   Total Bilirubin 1.0 0.3 - 1.2 mg/dL   GFR calc non Af Amer >60 >60 mL/min   GFR calc Af Amer >60 >60 mL/min    Comment: (NOTE) The eGFR has been calculated using the CKD EPI equation. This calculation has not been validated in all clinical situations. eGFR's persistently <60 mL/min signify possible Chronic Kidney Disease.    Anion gap 9 5 - 15    Comment: Performed at [facility], [place] [address] [place], [place] [address]  Ethanol     Status: None   Collection Time: 02/20/18  7:41 PM  Result Value Ref Range   Alcohol, Ethyl (B) <10 <10 mg/dL    Comment:        LOWEST DETECTABLE LIMIT FOR SERUM ALCOHOL IS 10 mg/dL FOR MEDICAL PURPOSES ONLY Performed at [facility], [place] [address], [place],  [address]   CBC with Diff     Status: None   Collection Time: 02/20/18  7:41 PM  Result Value Ref Range   WBC 7.9 4.0 - 10.5 K/uL   RBC 4.47 4.22 - 5.81 MIL/uL   Hemoglobin 14.3 13.0 - 17.0 g/dL   HCT 41.0 39.0 - 52.0 %   MCV 91.7 78.0 - 100.0 fL   MCH 32.0 26.0 - 34.0 pg   MCHC 34.9 30.0 - 36.0 g/dL   RDW 13.1 11.5 - 15.5 %   Platelets 240 150 - 400 K/uL   Neutrophils Relative % 65 %   Neutro Abs 5.1 1.7 - 7.7 K/uL   Lymphocytes Relative 25 %   Lymphs Abs 2.0 0.7 - 4.0 K/uL   Monocytes Relative 9 %   Monocytes Absolute 0.7 0.1 - 1.0 K/uL   Eosinophils Relative 1 %   Eosinophils Absolute 0.1 0.0 - 0.7 K/uL   Basophils Relative 0 %   Basophils Absolute 0.0 0.0 - 0.1 K/uL    Comment: Performed at [facility], [place] [address] [place], [place] [address]  Acetaminophen level     Status: Abnormal   Collection Time: 02/20/18   7:41 PM  Result Value Ref Range   Acetaminophen (Tylenol), Serum <10 (L) 10 - 30 ug/mL    Comment:        THERAPEUTIC CONCENTRATIONS VARY SIGNIFICANTLY. A RANGE OF 10-30 ug/mL MAY BE AN EFFECTIVE CONCENTRATION FOR MANY PATIENTS. HOWEVER, SOME ARE BEST  TREATED AT CONCENTRATIONS OUTSIDE THIS RANGE. ACETAMINOPHEN CONCENTRATIONS >150 ug/mL AT 4 HOURS AFTER INGESTION AND >50 ug/mL AT 12 HOURS AFTER INGESTION ARE OFTEN ASSOCIATED WITH TOXIC REACTIONS. Performed at [facility], [place] [address], [place], [place] [address]   Salicylate level     Status: None   Collection Time: 02/20/18  7:41 PM  Result Value Ref Range   Salicylate Lvl <2.8 2.8 - 30.0 mg/dL    Comment: Performed at [facility], [place] [address], [place], [place] [address]  Urine rapid drug screen (hosp performed)     Status: None   Collection Time: 02/20/18  7:48 PM  Result Value Ref Range   Opiates NONE DETECTED NONE DETECTED   Cocaine NONE DETECTED NONE DETECTED   Benzodiazepines NONE DETECTED NONE DETECTED   Amphetamines NONE DETECTED NONE DETECTED   Tetrahydrocannabinol NONE DETECTED NONE DETECTED   Barbiturates NONE DETECTED NONE DETECTED    Comment: (NOTE) DRUG SCREEN FOR MEDICAL PURPOSES ONLY.  IF CONFIRMATION IS NEEDED FOR ANY PURPOSE, NOTIFY LAB WITHIN 5 DAYS. LOWEST DETECTABLE LIMITS FOR URINE DRUG SCREEN Drug Class                     Cutoff (ng/mL) Amphetamine and metabolites    1000 Barbiturate and metabolites    200 Benzodiazepine                 572 Tricyclics and metabolites     300 Opiates and metabolites        300 Cocaine and metabolites        300 THC                            50 Performed at [facility], [place] [address], [place], [place] [address]   Urinalysis, Routine w reflex microscopic     Status: Abnormal   Collection Time: 02/20/18  7:48 PM  Result Value Ref Range   Color, Urine YELLOW YELLOW   APPearance CLOUDY (A) CLEAR    Specific Gravity, Urine 1.012 1.005 - 1.030   pH 5.0 5.0 - 8.0   Glucose, UA NEGATIVE NEGATIVE mg/dL   Hgb urine dipstick SMALL (A) NEGATIVE   Bilirubin Urine NEGATIVE NEGATIVE   Ketones, ur NEGATIVE NEGATIVE mg/dL   Protein, ur NEGATIVE NEGATIVE mg/dL   Nitrite NEGATIVE NEGATIVE   Leukocytes, UA NEGATIVE NEGATIVE   RBC / HPF 0-5 0 - 5 RBC/hpf   WBC, UA 0-5 0 - 5 WBC/hpf   Bacteria, UA NONE SEEN NONE SEEN   Squamous Epithelial / LPF 0-5 (A) NONE SEEN    Comment: Performed at [facility], [place] [address], [place], [place] [address]    Current Facility-Administered Medications  Medication Dose Route Frequency Provider Last Rate Last Dose  . ibuprofen (ADVIL,MOTRIN) tablet 600 mg  600 mg Oral Q8H PRN Lorin Glass, PA-C   600 mg at 02/21/18 6384   Current Outpatient Medications  Medication Sig Dispense Refill  . acetaminophen (TYLENOL) 500 MG tablet Take 500 mg by mouth every 6 (six) hours as needed for moderate pain.      Musculoskeletal: Strength & Muscle Tone: within normal limits Gait & Station: normal Patient leans: N/A  Psychiatric Specialty Exam: Physical Exam  Nursing note and vitals reviewed. Constitutional: He is oriented to person, place, and time. He appears well-developed and well-nourished.  HENT:  Head: Normocephalic and atraumatic.  Neck: Normal  range of motion.  Respiratory: Effort normal.  Neurological: He is alert and oriented to person, place, and time.  Psychiatric: His speech is normal and behavior is normal. Judgment normal. Cognition and memory are normal. He exhibits a depressed mood. He expresses suicidal ideation. He expresses suicidal plans.    Review of Systems  Psychiatric/Behavioral: Positive for depression and suicidal ideas.  All other systems reviewed and are negative.   Blood pressure 126/76, pulse 78, temperature 98 F (36.7 C), temperature source Oral, resp. rate 18, SpO2 96 %.There is no height or weight on  file to calculate BMI.  General Appearance: Casual  Eye Contact:  Fair  Speech:  Normal Rate  Volume:  Decreased  Mood:  Depressed  Affect:  Congruent  Thought Process:  Coherent and Descriptions of Associations: Intact  Orientation:  Full (Time, Place, and Person)  Thought Content:  WDL and Logical  Suicidal Thoughts:  Yes.  with intent/plan  Homicidal Thoughts:  No  Memory:  Immediate;   Fair Recent;   Fair Remote;   Fair  Judgement:  Fair  Insight:  Fair  Psychomotor Activity:  Decreased  Concentration:  Concentration: Fair and Attention Span: Fair  Recall:  [employer] of Knowledge:  Fair  Language:  Fair  Akathisia:  No  Handed:  Right  AIMS (if indicated):   N/A  Assets:  Leisure Time Physical Health Resilience  ADL's:  Intact  Cognition:  WNL  Sleep:   N/A     Treatment Plan Summary: Daily contact with patient to assess and evaluate symptoms and progress in treatment, Medication management and Plan major depressive disorder, recurrent, severe without psychosis:  -Crisis stabilization -Medication management:  Started Celexa 10 mg daily for depression -Individual counseling  Disposition: Recommend psychiatric Inpatient admission when medically cleared.  Waylan Boga, NP 02/22/2018 8:37 AM   Patient seen face-to-face for psychiatric evaluation, chart reviewed and case discussed with the physician extender and developed treatment plan. Reviewed the information documented and agree with the treatment plan.  Buford Dresser, DO 02/22/18 3:38 PM

## 2018-02-22 NOTE — ED Notes (Signed)
[facility] called to [facility] [place] [facility]

## 2018-02-23 DIAGNOSIS — F332 Major depressive disorder, recurrent severe without psychotic features: Principal | ICD-10-CM

## 2018-02-23 DIAGNOSIS — F4310 Post-traumatic stress disorder, unspecified: Secondary | ICD-10-CM | POA: Diagnosis present

## 2018-02-23 DIAGNOSIS — F1020 Alcohol dependence, uncomplicated: Secondary | ICD-10-CM | POA: Diagnosis present

## 2018-02-23 DIAGNOSIS — S82892S Other fracture of left lower leg, sequela: Secondary | ICD-10-CM

## 2018-02-23 MED ORDER — GABAPENTIN 300 MG PO CAPS
300.0000 mg | ORAL_CAPSULE | Freq: Three times a day (TID) | ORAL | Status: DC
  Administered 2018-02-23 – 2018-03-01 (×19): 300 mg via ORAL
  Filled 2018-02-23 (×20): qty 1

## 2018-02-23 NOTE — Plan of Care (Signed)
Patient is up at lib ambulates by a wheelchair due to R) foot surgical repair.  Reports that he slept good last night without the use of sleep aid. Report that his appetite is fair. Rates his depression a 5, hopelessness a 6 and his anxiety a 4. Denies SI/HI/AVH. Milieu remains safe with q 15 minute safety checks.
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
## 2018-02-23 NOTE — Progress Notes (Signed)
Recreation Therapy Notes  INPATIENT RECREATION THERAPY ASSESSMENT  Patient Details Name: Allen Travis MRN: [id number] DOB: December 15, 1955 Today's Date: 02/23/2018       Information Obtained From: Patient  Able to Participate in Assessment/Interview: Yes  Patient Presentation: Responsive  Reason for Admission (Per Patient): Med Non-Compliance, Suicidal Ideation, Other (Comments)(Not able to cope with things alone)  Patient Stressors: Other (Comment)(Broken leg)  Coping Skills:   TV, Other (Comment)(Walk)  Leisure Interests (2+):  Exercise - Walking, Sports - Racing, Sports - Basketball, [employer] - Technical brewerMovies  Frequency of Recreation/Participation: Marketing executiveWeekly  Awareness of Community Resources:  Yes  Community Resources:  Park, Other (Comment)(AA Meetings)  Current Use: Yes  If no, Barriers?:    Expressed Interest in [address] [employer] CorporationCommunity Resource Information:    [place]  Patient Main Form of Transportation: Therapist, musicublic Transportation  Patient Strengths:  How to deal with other people  Patient Identified Areas of Improvement:  Change surronding and get a new way of life/fresh start  Patient Goal for Hospitalization:  Get a new life  Current SI (including self-harm):  No  Current HI:  No  Current AVH: No  Staff Intervention Plan: Group Attendance, Collaborate with Interdisciplinary Treatment Team  Consent to Intern Participation: N/A  Kamilia Carollo 02/23/2018, 2:42 PM

## 2018-02-23 NOTE — [facility] Group Notes (Signed)
02/23/2018 1PM  Type of Therapy and Topic:  Group Therapy:  Feelings around Relapse and Recovery  Participation Level:  Active   Description of Group:    Patients in this group will discuss emotions they experience before and after a relapse. They will process how experiencing these feelings, or avoidance of experiencing them, relates to having a relapse. Facilitator will guide patients to explore emotions they have related to recovery. Patients will be encouraged to process which emotions are more powerful. They will be guided to discuss the emotional reaction significant others in their lives may have to patients' relapse or recovery. Patients will be assisted in exploring ways to respond to the emotions of others without this contributing to a relapse.  Therapeutic Goals: 1. Patient will identify two or more emotions that lead to a relapse for them 2. Patient will identify two emotions that result when they relapse 3. Patient will identify two emotions related to recovery 4. Patient will demonstrate ability to communicate their needs through discussion and/or role plays   Summary of Patient Progress: Actively and appropriately engaged in the group. Patient was able to provide support and validation to other group members.Patient practiced active listening when interacting with the facilitator and other group members Patient in still in the process of obtaining treatment goals. Leonette MostCharles says "I felt bad and I was self-medicating with alcohol. I knew that I would eventually come down off of my high. I also lost my medications and wasn't able to take them."    Therapeutic Modalities:   Cognitive Behavioral Therapy Solution-Focused Therapy Assertiveness Training Relapse Prevention Therapy   Johny ShearsCassandra  Jamine Highfill, LCSW 02/23/2018 2:26 PM

## 2018-02-23 NOTE — Progress Notes (Signed)
Recreation Therapy Notes  Date: 02/23/2018  Time: 9:30 am  Location: Craft Room  Behavioral response: Appropriate  Intervention Topic: Stress  Discussion/Intervention:  Group content on today was focused on stress. The group defined stress and way to cope with stress. Participants expressed how they know when they are stresses out. Individuals described the different ways they have to cope with stress. The group stated reasons why it is important to cope with stress. Patient explained what good stress is and some examples. The group participated in the intervention "Stress Management Jeopardy". Individuals were separated into two group and answered questions related to stress.  Clinical Observations/Feedback:  Patient came to group and stated his stress comes from dealing with the wrong people. He stated stress is just a test. Individual participated in the intervention and was social with peers and staff.  Suheyb Raucci LRT/CTRS         Airam Heidecker 02/23/2018 12:01 PM

## 2018-02-23 NOTE — [facility] Suicide Risk Assessment (Signed)
[facility] INPATIENT:  Family/Significant Other Suicide Prevention Education  Suicide Prevention Education:  Patient Refusal for Family/Significant Other Suicide Prevention Education: The patient Allen Travis has refused to provide written consent for family/significant other to be provided Family/Significant Other Suicide Prevention Education during admission and/or prior to discharge.  Physician notified.  Alease FrameSonya S Garry, LCSW 02/23/2018, 12:13 PM

## 2018-02-23 NOTE — Progress Notes (Signed)
Patient ID: Allen Travis, male   DOB: [birth year], 62 y.o.   MRN: [id number] CSW [facility] in order for pt to obtain some clothing and shoes.  CSW also gave pt a list of boarding house in the community and informed him that he needed to contact them to inquire about openings as well as obtain other information that he needed.  Pt asked CSW if she could get him some information about senior housing and applying for Section 8 housing.  CSW informed pt that she would gather some information for him.

## 2018-02-23 NOTE — Tx Team (Addendum)
Interdisciplinary Treatment and Diagnostic Plan Update  02/23/2018 Time of Session: 10:40 AM Allen Travis MRN: [id number]  Principal Diagnosis: Major depressive disorder, recurrent severe without psychotic features (HCC)  Secondary Diagnoses: Principal Problem:   Major depressive disorder, recurrent severe without psychotic features (HCC) Active Problems:   Alcohol use disorder, moderate, dependence (HCC)   Closed left ankle fracture, sequela   PTSD (post-traumatic stress disorder)   Current Medications:  Current Facility-Administered Medications  Medication Dose Route Frequency Provider Last Rate Last Dose  . acetaminophen (TYLENOL) tablet 650 mg  650 mg Oral Q6H PRN Pucilowska, Jolanta B, MD      . alum & mag hydroxide-simeth (MAALOX/MYLANTA) 200-200-20 MG/5ML suspension 30 mL  30 mL Oral Q4H PRN Pucilowska, Jolanta B, MD      . citalopram (CELEXA) tablet 10 mg  10 mg Oral Daily Charm RingsLord, Jamison Y, NP   10 mg at 02/23/18 0836  . gabapentin (NEURONTIN) capsule 300 mg  300 mg Oral TID Pucilowska, Jolanta B, MD   300 mg at 02/23/18 1227  . hydrOXYzine (ATARAX/VISTARIL) tablet 50 mg  50 mg Oral TID PRN Pucilowska, Jolanta B, MD      . ibuprofen (ADVIL,MOTRIN) tablet 600 mg  600 mg Oral Q6H PRN Pucilowska, Jolanta B, MD   600 mg at 02/22/18 2106  . traZODone (DESYREL) tablet 100 mg  100 mg Oral QHS PRN Pucilowska, Jolanta B, MD       PTA Medications: Medications Prior to Admission  Medication Sig Dispense Refill Last Dose  . acetaminophen (TYLENOL) 500 MG tablet Take 500 mg by mouth every 6 (six) hours as needed for moderate pain.   Past Week at Unknown time    Patient Stressors: Financial difficulties Loss of home Medication change or noncompliance Substance abuse Other: No support system  Patient Strengths: Capable of independent living Manufacturing systems engineerCommunication skills Motivation for treatment/growth  Treatment Modalities: Medication Management, Group therapy, Case management,  1 to 1  session with clinician, Psychoeducation, Recreational therapy.   Physician Treatment Plan for Primary Diagnosis: Major depressive disorder, recurrent severe without psychotic features (HCC) Long Term Goal(s): Improvement in symptoms so as ready for discharge Improvement in symptoms so as ready for discharge   Short Term Goals: Ability to identify changes in lifestyle to reduce recurrence of condition will improve Ability to verbalize feelings will improve Ability to disclose and discuss suicidal ideas Ability to demonstrate self-control will improve Ability to identify and develop effective coping behaviors will improve Ability to maintain clinical measurements within normal limits will improve Compliance with prescribed medications will improve Ability to identify triggers associated with substance abuse/mental health issues will improve Ability to identify changes in lifestyle to reduce recurrence of condition will improve Ability to demonstrate self-control will improve Ability to identify triggers associated with substance abuse/mental health issues will improve  Medication Management: Evaluate patient's response, side effects, and tolerance of medication regimen.  Therapeutic Interventions: 1 to 1 sessions, Unit Group sessions and Medication administration.  Evaluation of Outcomes: Progressing  Physician Treatment Plan for Secondary Diagnosis: Principal Problem:   Major depressive disorder, recurrent severe without psychotic features (HCC) Active Problems:   Alcohol use disorder, moderate, dependence (HCC)   Closed left ankle fracture, sequela   PTSD (post-traumatic stress disorder)  Long Term Goal(s): Improvement in symptoms so as ready for discharge Improvement in symptoms so as ready for discharge   Short Term Goals: Ability to identify changes in lifestyle to reduce recurrence of condition will improve Ability to verbalize feelings will improve  Ability to disclose and  discuss suicidal ideas Ability to demonstrate self-control will improve Ability to identify and develop effective coping behaviors will improve Ability to maintain clinical measurements within normal limits will improve Compliance with prescribed medications will improve Ability to identify triggers associated with substance abuse/mental health issues will improve Ability to identify changes in lifestyle to reduce recurrence of condition will improve Ability to demonstrate self-control will improve Ability to identify triggers associated with substance abuse/mental health issues will improve     Medication Management: Evaluate patient's response, side effects, and tolerance of medication regimen.  Therapeutic Interventions: 1 to 1 sessions, Unit Group sessions and Medication administration.  Evaluation of Outcomes: Progressing   RN Treatment Plan for Primary Diagnosis: Major depressive disorder, recurrent severe without psychotic features (HCC) Long Term Goal(s): Knowledge of disease and therapeutic regimen to maintain health will improve  Short Term Goals: Ability to demonstrate self-control, Ability to identify and develop effective coping behaviors will improve and Compliance with prescribed medications will improve  Medication Management: RN will administer medications as ordered by provider, will assess and evaluate patient's response and provide education to patient for prescribed medication. RN will report any adverse and/or side effects to prescribing provider.  Therapeutic Interventions: 1 on 1 counseling sessions, Psychoeducation, Medication administration, Evaluate responses to treatment, Monitor vital signs and CBGs as ordered, Perform/monitor CIWA, COWS, AIMS and Fall Risk screenings as ordered, Perform wound care treatments as ordered.  Evaluation of Outcomes: Progressing   LCSW Treatment Plan for Primary Diagnosis: Major depressive disorder, recurrent severe without  psychotic features (HCC) Long Term Goal(s): Safe transition to appropriate next level of care at discharge, Engage patient in therapeutic group addressing interpersonal concerns.  Short Term Goals: Engage patient in aftercare planning with referrals and resources and Increase skills for wellness and recovery  Therapeutic Interventions: Assess for all discharge needs, 1 to 1 time with Social worker, Explore available resources and support systems, Assess for adequacy in community support network, Educate family and significant other(s) on suicide prevention, Complete Psychosocial Assessment, Interpersonal group therapy.  Evaluation of Outcomes: Progressing   Progress in Treatment: Attending groups: Yes. Participating in groups: Yes. Taking medication as prescribed: Yes. Toleration medication: Yes. Family/Significant other contact made: No, will contact:  Pt did not identify or give consent for contact of a support person. Patient understands diagnosis: Yes. Discussing patient identified problems/goals with staff: Yes. Medical problems stabilized or resolved: Yes. Denies suicidal/homicidal ideation: Yes. Issues/concerns per patient self-inventory: No. Other: n/a  New problem(s) identified: No, Describe:  No new problems identified  New Short Term/Long Term Goal(s): Pt stated his goal for this hospitalization is "get back on my feet. Learn to cope with things better and get back on my meds"  Discharge Plan or Barriers: Tentative plan is for Rahn to go to a boarding house with outpatient services in the community in which he will be residing.  Swain is also interested in applying for Section 8 or [employer].  Reason for Continuation of Hospitalization: Anxiety Depression Medication stabilization  Estimated Length of Stay: 5-7 days  Recreational Therapy: Patient Stressors: Broken Leg Patient Goal: Patient will identify 3 positive coping skills strategies to use post  d/c within 5 recreation therapy group sessions  Attendees: Patient: Allen Travis 02/23/2018 2:00 PM  Physician: Kristine Linea, MD 02/23/2018 2:00 PM  Nursing: Bruce Donath, RN 02/23/2018 2:00 PM  RN Care Manager: 02/23/2018 2:00 PM  Social Worker: Huey Romans, LCSW 02/23/2018 2:00 PM  Recreational Therapist: Danella Deis  Outlaw, LRT 02/23/2018 2:00 PM  Other: Rodena Goldmann, LCSWA 02/23/2018 2:00 PM  Other: Heidi Dach, LCSW 02/23/2018 2:00 PM  Other: 02/23/2018 2:00 PM    Scribe for Treatment Team: Alease Frame, LCSW 02/23/2018 2:00 PM
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
## 2018-02-24 MED ORDER — CITALOPRAM HYDROBROMIDE 20 MG PO TABS
20.0000 mg | ORAL_TABLET | Freq: Every day | ORAL | Status: DC
  Administered 2018-02-25 – 2018-03-01 (×5): 20 mg via ORAL
  Filled 2018-02-24 (×5): qty 1

## 2018-02-24 NOTE — [facility] Group Notes (Signed)
LCSW Group Therapy Note 02/24/2018 1:15pm  Type of Therapy and Topic: Group Therapy: Feelings Around Returning Home & Establishing a Supportive Framework and Supporting Oneself When Supports Not Available  Participation Level: Did Not Attend  Description of Group:  Patients first processed thoughts and feelings about upcoming discharge. These included fears of upcoming changes, lack of change, new living environments, judgements and expectations from others and overall stigma of mental health issues. The group then discussed the definition of a supportive framework, what that looks and feels like, and how do to discern it from an unhealthy non-supportive network. The group identified different types of supports as well as what to do when your family/friends are less than helpful or unavailable  Therapeutic Goals  1. Patient will identify one healthy supportive network that they can use at discharge. 2. Patient will identify one factor of a supportive framework and how to tell it from an unhealthy network. 3. Patient able to identify one coping skill to use when they do not have positive supports from others. 4. Patient will demonstrate ability to communicate their needs through discussion and/or role plays.  Summary of Patient Progress:    Therapeutic Modalities Cognitive Behavioral Therapy Motivational Interviewing   Allen Travis  CUEBAS-COLON, LCSW 02/24/2018 4:04 PM

## 2018-02-24 NOTE — Progress Notes (Signed)
[facility] MD Progress Note  02/24/2018 1:01 PM Allen SilenceCharles Travis  MRN:  [id number] Subjective:   I am still depressed" Pt reportedly noncompliant with boot for his leg, but taking meds. Pt lying in bed, endorses depression, and suicidal ideation on and off, no plan, contracts for safety. Denies side effects of meds.  Principal Problem: Major depressive disorder, recurrent severe without psychotic features (HCC) Diagnosis:   Patient Active Problem List   Diagnosis Date Noted  . Alcohol use disorder, moderate, dependence (HCC) [F10.20] 02/23/2018  . Closed left ankle fracture, sequela [S82.892S] 02/23/2018  . PTSD (post-traumatic stress disorder) [F43.10] 02/23/2018  . Major depressive disorder, recurrent severe without psychotic features (HCC) [F33.2] 02/22/2018  . Suicidal ideations [R45.851] 02/21/2018   Total Time spent with patient: 30 minutes  Past Psychiatric History: no ne winfo   Past Medical History:  Past Medical History:  Diagnosis Date  . Hypertension     Past Surgical History:  Procedure Laterality Date  . FRACTURE SURGERY     Family History: History reviewed. No pertinent family history. Family Psychiatric  History: no new info  Social History:  Social History   Substance and Sexual Activity  Alcohol Use Yes     Social History   Substance and Sexual Activity  Drug Use Not on file    Social History   Socioeconomic History  . Marital status: Single    Spouse name: Not on file  . Number of children: Not on file  . Years of education: Not on file  . Highest education level: Not on file  Occupational History  . Not on file  Social Needs  . Financial resource strain: Not on file  . Food insecurity:    Worry: Not on file    Inability: Not on file  . Transportation needs:    Medical: Not on file    Non-medical: Not on file  Tobacco Use  . Smoking status: Current Some Day Smoker  . Smokeless tobacco: Never Used  Substance and Sexual Activity  . Alcohol use:  Yes  . Drug use: Not on file  . Sexual activity: Not on file  Lifestyle  . Physical activity:    Days per week: Not on file    Minutes per session: Not on file  . Stress: Not on file  Relationships  . Social connections:    Talks on phone: Not on file    Gets together: Not on file    Attends religious service: Not on file    Active member of club or organization: Not on file    Attends meetings of clubs or organizations: Not on file    Relationship status: Not on file  Other Topics Concern  . Not on file  Social History Narrative  . Not on file   Additional Social History:                         Sleep: Good  Appetite:  Fair  Current Medications: Current Facility-Administered Medications  Medication Dose Route Frequency Provider Last Rate Last Dose  . acetaminophen (TYLENOL) tablet 650 mg  650 mg Oral Q6H PRN Pucilowska, Jolanta B, MD      . alum & mag hydroxide-simeth (MAALOX/MYLANTA) 200-200-20 MG/5ML suspension 30 mL  30 mL Oral Q4H PRN Pucilowska, Jolanta B, MD      . citalopram (CELEXA) tablet 10 mg  10 mg Oral Daily Charm RingsLord, Jamison Y, NP   10 mg at 02/24/18 0825  .  gabapentin (NEURONTIN) capsule 300 mg  300 mg Oral TID Pucilowska, Jolanta B, MD   300 mg at 02/24/18 1228  . hydrOXYzine (ATARAX/VISTARIL) tablet 50 mg  50 mg Oral TID PRN Pucilowska, Jolanta B, MD      . ibuprofen (ADVIL,MOTRIN) tablet 600 mg  600 mg Oral Q6H PRN Pucilowska, Jolanta B, MD   600 mg at 02/22/18 2106  . traZODone (DESYREL) tablet 100 mg  100 mg Oral QHS PRN Pucilowska, Jolanta B, MD        Lab Results: No results found for this or any previous visit (from the past 48 hour(s)).  Blood Alcohol level:  Lab Results  Component Value Date   ETH <10 02/20/2018    Metabolic Disorder Labs: No results found for: HGBA1C, MPG No results found for: PROLACTIN No results found for: CHOL, TRIG, HDL, CHOLHDL, VLDL, LDLCALC  Physical Findings: AIMS:  , ,  ,  ,    CIWA:    COWS:      Musculoskeletal: Strength & Muscle Tone: within normal limits Gait & Station: unsteady, uses WC Patient leans:   Psychiatric Specialty Exam: Physical Exam  Nursing note and vitals reviewed.   ROS  Blood pressure 140/86, pulse 93, temperature 98.1 F (36.7 C), temperature source Oral, resp. rate 16, height 6\' 7"  (2.007 m), weight 103 kg (227 lb), SpO2 98 %.Body mass index is 25.57 kg/m.  General Appearance: Disheveled  Eye Contact:  Minimal  Speech:  Slow  Volume:  Normal  Mood:  Depressed  Affect:  Constricted  Thought Process:  Linear  Orientation:  ox3  Thought Content:  depression  Suicidal Thoughts:  Yes, on and off, no plan.   Homicidal Thoughts:  No  Memory:  intact  Judgement:  Impaired  Insight:  Shallow  Psychomotor Activity:  Decreased  Concentration:  fair  Recall:  Good  Fund of Knowledge:  Good  Language:  Good  Akathisia:  No  Handed:    AIMS (if indicated):     Assets:  Communication Skills  ADL's:  Intact  Cognition:  fair  Sleep:  Number of Hours: 7     Treatment Plan Summary: Daily contact with patient to assess and evaluate symptoms and progress in treatment and Medication management. Pt still depressed, endorsing intermittent SI, no plan.  increase celexa.   Beverly Sessions, MD 02/24/2018, 1:01 PM

## 2018-02-24 NOTE — Plan of Care (Addendum)
Patient found in day room upon my arrival. Patient is visible and somewhat social throughout the evening. Patient utilizing wheelchair for ambulation. Patient has hard boot in room as well. Patient reports right leg pain is manageable this evening. Denies need for analgesic. Denies SI/HI/AVH. Reports continued depression and anxiety but reports improvement. Affect is full range. Patient is pleasant and cooperative. Reports eating and voiding adequately. Patient has no scheduled HS medications, denies need for Trazodone for sleep. Compliant with unit routine and staff direction. Q 15 minute checks maintained. Will continue to monitor throughout the shift.  Patient slept 7 hours. No apparent distress. Will endorse care to oncoming shift.  Problem: Coping: Goal: Coping ability will improve Outcome: Progressing   Problem: Medication: Goal: Compliance with prescribed medication regimen will improve Outcome: Progressing   Problem: Self-Concept: Goal: Will verbalize positive feelings about self Outcome: Progressing   Problem: Elimination: Goal: Will not experience complications related to bowel motility Outcome: Progressing   Problem: Pain Managment: Goal: General experience of comfort will improve Outcome: Progressing

## 2018-02-24 NOTE — [facility] Group Notes (Signed)
[facility] Group Notes:  (Nursing/MHT/Case Management/Adjunct)  Date:  02/24/2018  Time:  9:46 PM  Type of Therapy:  Group Therapy  Participation Level:  Active  Participation Quality:  Appropriate  Affect:  Appropriate  Cognitive:  Alert  Insight:  Appropriate  Engagement in Group:  Engaged  Modes of Intervention:  Support  Summary of Progress/Problems:  Mayra NeerJackie L Zakyla Tonche 02/24/2018, 9:46 PM

## 2018-02-24 NOTE — Plan of Care (Signed)
Patient is up at lib ambulates by a wheelchair due to R) foot surgical repair.  Reports that he slept good last night with the use of sleep aid. Reports that his appetite is fair. Rates his depression a 7, hopelessness a 8 and his anxiety a 6 but patient's affect and demeanor does not reflect what is beng reported. Patient is pleasant,  observed laughing and smiling while up in the dayroom. Denies SI/HI/AVH. Milieu remains safe with q 15 minute safety checks.

## 2018-02-24 NOTE — Plan of Care (Addendum)
Patient found in day room playing cards with peers. Patient is visible and social throughout the evening. Complains of right foot pain, given Tylenol. Will monitor for efficacy. Denies HI/AVH. Reports intermittent SI with no plan or intent. Contracts for safety. Continues to report depression and anxiety. Affect is congruent, less animated tonight. Reports eating and voiding adequately. Patient ate large snack tonight. Patient is cooperative and appropriate throughout our interaction. Patient is grateful and complimentary for care provided here. Patient has no scheduled HS medication. Compliant with unit routine and staff direction. Q 15 minute checks maintained. Will continue to monitor throughout the shift. Slept 7.25. No apparent distress. Upon waking, patient complains of right foot pain, given Motrin. Will endorse care to oncoming shift.  Problem: Coping: Goal: Coping ability will improve Outcome: Progressing   Problem: Medication: Goal: Compliance with prescribed medication regimen will improve Outcome: Progressing   Problem: Self-Concept: Goal: Ability to disclose and discuss suicidal ideas will improve Outcome: Progressing   Problem: Elimination: Goal: Will not experience complications related to bowel motility Outcome: Progressing

## 2018-02-25 NOTE — Progress Notes (Signed)
[facility]  02/25/2018 3:43 PM Allen SilenceCharles Travis  MRN:  [id number] Subjective:   "Not too good""  Pt lying in bed, endorses depression, and suicidal ideation intermittently, last SI last night, no plan, contracts for safety. Taking meds, Denies side effects of meds.  Principal Problem: Major depressive disorder, recurrent severe without psychotic features (HCC) Diagnosis:   Patient Active Problem List   Diagnosis Date Noted  . Alcohol use disorder, moderate, dependence (HCC) [F10.20] 02/23/2018  . Closed left ankle fracture, sequela [S82.892S] 02/23/2018  . PTSD (post-traumatic stress disorder) [F43.10] 02/23/2018  . Major depressive disorder, recurrent severe without psychotic features (HCC) [F33.2] 02/22/2018  . Suicidal ideations [R45.851] 02/21/2018   Total Time spent with patient: 30 minutes  Past Psychiatric History: no new information  Past Medical History:  Past Medical History:  Diagnosis Date  . Hypertension     Past Surgical History:  Procedure Laterality Date  . FRACTURE SURGERY     Family History: History reviewed. No pertinent family history. Family Psychiatric  History: no new info  Social History:  Social History   Substance and Sexual Activity  Alcohol Use Yes     Social History   Substance and Sexual Activity  Drug Use Not on file    Social History   Socioeconomic History  . Marital status: Single    Spouse name: Not on file  . Number of children: Not on file  . Years of education: Not on file  . Highest education level: Not on file  Occupational History  . Not on file  Social Needs  . Financial resource strain: Not on file  . Food insecurity:    Worry: Not on file    Inability: Not on file  . Transportation needs:    Medical: Not on file    Non-medical: Not on file  Tobacco Use  . Smoking status: Current Some Day Smoker  . Smokeless tobacco: Never Used  Substance and Sexual Activity  . Alcohol use: Yes  . Drug use: Not on file   . Sexual activity: Not on file  Lifestyle  . Physical activity:    Days per week: Not on file    Minutes per session: Not on file  . Stress: Not on file  Relationships  . Social connections:    Talks on phone: Not on file    Gets together: Not on file    Attends religious service: Not on file    Active member of club or organization: Not on file    Attends meetings of clubs or organizations: Not on file    Relationship status: Not on file  Other Topics Concern  . Not on file  Social History Narrative  . Not on file   Additional Social History:                         Sleep: Good  Appetite:  Fair  Current Medications: Current Facility-Administered Medications  Medication Dose Route Frequency Provider Last Rate Last Dose  . acetaminophen (TYLENOL) tablet 650 mg  650 mg Oral Q6H PRN Pucilowska, Jolanta B, MD   650 mg at 02/24/18 2142  . alum & mag hydroxide-simeth (MAALOX/MYLANTA) 200-200-20 MG/5ML suspension 30 mL  30 mL Oral Q4H PRN Pucilowska, Jolanta B, MD      . citalopram (CELEXA) tablet 20 mg  20 mg Oral Daily Beverly SessionsSubedi, Claretha Townshend, MD   20 mg at 02/25/18 0823  . gabapentin (NEURONTIN) capsule 300 mg  300 mg Oral TID Pucilowska, Jolanta B, MD   300 mg at 02/25/18 1126  . hydrOXYzine (ATARAX/VISTARIL) tablet 50 mg  50 mg Oral TID PRN Pucilowska, Jolanta B, MD      . ibuprofen (ADVIL,MOTRIN) tablet 600 mg  600 mg Oral Q6H PRN Pucilowska, Jolanta B, MD   600 mg at 02/25/18 0619  . traZODone (DESYREL) tablet 100 mg  100 mg Oral QHS PRN Pucilowska, Jolanta B, MD        Lab Results: No results found for this or any previous visit (from the past 48 hour(s)).  Blood Alcohol level:  Lab Results  Component Value Date   ETH <10 02/20/2018    Metabolic Disorder Labs: No results found for: HGBA1C, MPG No results found for: PROLACTIN No results found for: CHOL, TRIG, HDL, CHOLHDL, VLDL, LDLCALC  Physical Findings: AIMS:  , ,  ,  ,    CIWA:    COWS:      Musculoskeletal: Strength & Muscle Tone: within normal limits Gait & Station: unsteady, uses WC Patient leans:   Psychiatric Specialty Exam: Physical Exam  Nursing note and vitals reviewed.   ROS  Blood pressure (!) 135/100, pulse 95, temperature 97.9 F (36.6 C), temperature source Oral, resp. rate 18, height 6\' 7"  (2.007 m), weight 103 kg (227 lb), SpO2 98 %.Body mass index is 25.57 kg/m.  General Appearance: age appropriate  Eye Contact:  Minimal  Speech:  Slow  Volume:  Normal  Mood:  " not too good"  Affect:  restricted  Thought Process:  Linear  Orientation:  ox3  Thought Content:  depression  Suicidal Thoughts:  Yes intermittent last yesterday night, no plan  Homicidal Thoughts:  No  Memory:  intact  Judgement:  Impaired  Insight:  Shallow  Psychomotor Activity:  Decreased  Concentration:  fair  Recall:  Good  Fund of Knowledge:  Good  Language:  Good  Akathisia:  No  Handed:    AIMS (if indicated):     Assets:  Communication Skills  ADL's:  Intact  Cognition:  fair  Sleep:  Number of Hours: 7.25     Treatment Plan Summary: Daily contact with patient to assess and evaluate symptoms and progress in treatment and Medication management. Pt continue to be depressed, reporting intermittent SI.   Celexa just increased, cont it.    Beverly Sessions, MD 02/25/2018, 3:43 PMPatient ID: Allen Travis, male   DOB: [birth year], 62 y.o.   MRN: [id number]

## 2018-02-25 NOTE — Plan of Care (Signed)
  Problem: Education: Goal: Ability to make informed decisions regarding treatment will improve Outcome: Progressing   Problem: Coping: Goal: Coping ability will improve Outcome: Progressing   Problem: Health Behavior/Discharge Planning: Goal: Identification of resources available to assist in meeting health care needs will improve Outcome: Progressing   Problem: Medication: Goal: Compliance with prescribed medication regimen will improve Outcome: Progressing   Problem: Self-Concept: Goal: Ability to disclose and discuss suicidal ideas will improve Outcome: Progressing Goal: Will verbalize positive feelings about self Outcome: Progressing   Problem: Elimination: Goal: Will not experience complications related to bowel motility Outcome: Progressing   Problem: Pain Managment: Goal: General experience of comfort will improve Outcome: Progressing

## 2018-02-25 NOTE — Progress Notes (Signed)
Patient pleasant and cooperative. Medication compliant. Appropriate with staff and peers. Denies SI, HI, AVH. Uses w/c for ambulation. Patient able to self propel and provide self care. Eating meals well.  Encouragement and support offered. Safety checks maintained. Pt receptive and remains safe on unit with q 15 min checks.

## 2018-02-25 NOTE — [facility] Group Notes (Signed)
LCSW Group Therapy Note 02/25/2018 1:00pm Type of Therapy and Topic:  Group Therapy:  Communication Participation Level:  Did Not Attend  Description of Group: Patients will identify how individuals communicate with one another appropriately and inappropriately.  Patients will be guided to discuss their thoughts, feelings and behaviors related to barriers when communicating.  The group will process together ways to execute positive and appropriate communication with attention given to how one uses behavior, tone and body language.  Patients will be encouraged to reflect on a situation where they were successfully able to communicate and what made this example successful.  Group will identify specific changes they are motivated to make in order to overcome communication barriers with self, peers, authority, and parents.  This group will be process-oriented with patients participating in exploration of their own experiences, giving and receiving support, and challenging self and other group members.   Therapeutic Goals 1. Patient will express an understanding of the four types of verbal communication (passive, aggressive, passive aggressive, and assertive).  2. Patient will identify feelings (such as fear or worry), thought process and behaviors related to why people internalize feelings rather than express self openly. 3. Patient will identify their main communication style, and report how they plan to change their style to assertive. 4. Members will then practice through role play how to communicate using I statements, I feel statements, and acknowledging feelings rather than displacing feelings on others Summary of Patient Progress: Pt was invited to attend group but chose not to attend. CSW will continue to encourage pt to attend group throughout their admission.    Therapeutic Modalities Cognitive Behavioral Therapy Motivational Interviewing Solution Focused Therapy  Heidi DachKelsey Djuna Frechette, MSW,  LCSW Clinical Social Worker 02/25/2018 1:50 PM

## 2018-02-25 NOTE — Plan of Care (Signed)
Patient is seeing in the milieu area socializing with peers without any issues, aware of his coping skills and maintaining safety, denies any SI/HI and no signs of AVH , patient is taking his medicines wit out any noticeable side effects, 15 minute safety rounds is in progress. Problem: Education: Goal: Ability to make informed decisions regarding treatment will improve Outcome: Progressing   Problem: Coping: Goal: Coping ability will improve Outcome: Progressing   Problem: Health Behavior/Discharge Planning: Goal: Identification of resources available to assist in meeting health care needs will improve Outcome: Progressing   Problem: Medication: Goal: Compliance with prescribed medication regimen will improve Outcome: Progressing   Problem: Self-Concept: Goal: Ability to disclose and discuss suicidal ideas will improve Outcome: Progressing Goal: Will verbalize positive feelings about self Outcome: Progressing   Problem: Elimination: Goal: Will not experience complications related to bowel motility Outcome: Progressing   Problem: Pain Managment: Goal: General experience of comfort will improve Outcome: Progressing

## 2018-02-26 MED ORDER — ADULT MULTIVITAMIN W/MINERALS CH
1.0000 | ORAL_TABLET | Freq: Every day | ORAL | Status: DC
  Administered 2018-02-26 – 2018-03-01 (×4): 1 via ORAL
  Filled 2018-02-26 (×4): qty 1

## 2018-02-26 NOTE — Progress Notes (Signed)
Patient ID: Allen Travis, male   DOB: January 19, 1956, 62 y.o.   MRN: [id number] CSW met with Allen Travis and assisted Allen Travis in calling [facility], and other rental places.  [facility] had previously told Allen Travis that he had an opening for him and then when talking to him said that he would need to check in with him next week. Allen Travis number is not working will try to find another number and give to Allen Travis. Allen Travis declined, Allen Travis did not want to call [facility]. CSW was clear that Allen Travis may not have any other options other than the shelter and that they are not always willing to accept Allen Travis's when they have an income. CSW asked him to follow up with [facility] as their office hours begin at 5:30 in the evening.  CSW agreed to follow up with him in the morning.  Dossie Arbour, LCSW

## 2018-02-26 NOTE — Progress Notes (Signed)
Recreation Therapy Notes   Date: 02/26/2018  Time: 9:30 am   Location: Craft Room   Behavioral response: N/A   Intervention Topic: Values   Discussion/Intervention: Patient did not attend group.   Clinical Observations/Feedback:  Patient did not attend group.   Tanvir Hipple LRT/CTRS        Jerre Vandrunen 02/26/2018 10:26 AM

## 2018-02-26 NOTE — Plan of Care (Signed)
Patient is calm and cooperative with care of ADLs and responding well to treatment, socializing with peers in the milieu area with out any issues, patient gets around on a wheel chair and is comfortable, takes his medicines as prescribed no complain or concerns voiced by patient no distress noted. Problem: Education: Goal: Ability to make informed decisions regarding treatment will improve Outcome: Progressing   Problem: Coping: Goal: Coping ability will improve Outcome: Progressing   Problem: Health Behavior/Discharge Planning: Goal: Identification of resources available to assist in meeting health care needs will improve Outcome: Progressing   Problem: Medication: Goal: Compliance with prescribed medication regimen will improve Outcome: Progressing   Problem: Self-Concept: Goal: Ability to disclose and discuss suicidal ideas will improve Outcome: Progressing Goal: Will verbalize positive feelings about self Outcome: Progressing   Problem: Elimination: Goal: Will not experience complications related to bowel motility Outcome: Progressing   Problem: Pain Managment: Goal: General experience of comfort will improve Outcome: Progressing

## 2018-02-26 NOTE — Progress Notes (Signed)
[facility]  02/26/2018 4:51 AM Arta SilenceCharles Travis  MRN:  [id number]  Subjective:  Mr. Allen Travis feel better. Mood is improving, affect is brighter. He still has passing thoughts of suicide but without a plan. He is now using orthopedic boot. He experiences less pain in his foot. He was able [facility] get in [name] a [facility], and hopes to get a room there.  There are no symptoms of alcohol withdrawal, VS are stable. He has not been participating in programming.  Principal Problem: Major depressive disorder, recurrent severe without psychotic features (HCC) Diagnosis:   Patient Active Problem List   Diagnosis Date Noted  . Major depressive disorder, recurrent severe without psychotic features (HCC) [F33.2] 02/22/2018    Priority: High  . Alcohol use disorder, moderate, dependence (HCC) [F10.20] 02/23/2018  . Closed left ankle fracture, sequela [S82.892S] 02/23/2018  . PTSD (post-traumatic stress disorder) [F43.10] 02/23/2018  . Suicidal ideations [R45.851] 02/21/2018   Total Time spent with patient: 20 minutes  Past Psychiatric History: depression, alcoholism   Past Medical History:  Past Medical History:  Diagnosis Date  . Hypertension     Past Surgical History:  Procedure Laterality Date  . FRACTURE SURGERY     Family History: History reviewed. No pertinent family history. Family Psychiatric  History: substance abuse Social History:  Social History   Substance and Sexual Activity  Alcohol Use Yes     Social History   Substance and Sexual Activity  Drug Use Not on file    Social History   Socioeconomic History  . Marital status: Single    Spouse name: Not on file  . Number of children: Not on file  . Years of education: Not on file  . Highest education level: Not on file  Occupational History  . Not on file  Social Needs  . Financial resource strain: Not on file  . Food insecurity:    Worry: Not on file    Inability: Not on file  .  Transportation needs:    Medical: Not on file    Non-medical: Not on file  Tobacco Use  . Smoking status: Current Some Day Smoker  . Smokeless tobacco: Never Used  Substance and Sexual Activity  . Alcohol use: Yes  . Drug use: Not on file  . Sexual activity: Not on file  Lifestyle  . Physical activity:    Days per week: Not on file    Minutes per session: Not on file  . Stress: Not on file  Relationships  . Social connections:    Talks on phone: Not on file    Gets together: Not on file    Attends religious service: Not on file    Active member of club or organization: Not on file    Attends meetings of clubs or organizations: Not on file    Relationship status: Not on file  Other Topics Concern  . Not on file  Social History Narrative  . Not on file   Additional Social History:                         Sleep: Fair  Appetite:  Fair  Current Medications: Current Facility-Administered Medications  Medication Dose Route Frequency Provider Last Rate Last Dose  . acetaminophen (TYLENOL) tablet 650 mg  650 mg Oral Q6H PRN Cathlin Buchan B, MD   650 mg at 02/24/18 2142  . alum & mag hydroxide-simeth (MAALOX/MYLANTA) 200-200-20 MG/5ML  suspension 30 mL  30 mL Oral Q4H PRN Kaiyana Bedore B, MD      . citalopram (CELEXA) tablet 20 mg  20 mg Oral Daily Beverly Sessions, MD   20 mg at 02/25/18 0823  . gabapentin (NEURONTIN) capsule 300 mg  300 mg Oral TID Sarvesh Meddaugh B, MD   300 mg at 02/25/18 1637  . hydrOXYzine (ATARAX/VISTARIL) tablet 50 mg  50 mg Oral TID PRN Alfretta Pinch B, MD      . ibuprofen (ADVIL,MOTRIN) tablet 600 mg  600 mg Oral Q6H PRN Jariya Reichow B, MD   600 mg at 02/25/18 0619  . traZODone (DESYREL) tablet 100 mg  100 mg Oral QHS PRN Kura Bethards B, MD        Lab Results: No results found for this or any previous visit (from the past 48 hour(s)).  Blood Alcohol level:  Lab Results  Component Value Date   ETH <10  02/20/2018    Metabolic Disorder Labs: No results found for: HGBA1C, MPG No results found for: PROLACTIN No results found for: CHOL, TRIG, HDL, CHOLHDL, VLDL, LDLCALC  Physical Findings: AIMS:  , ,  ,  ,    CIWA:    COWS:     Musculoskeletal: Strength & Muscle Tone: within normal limits Gait & Station: normal, uses a wheelchair to ambulate Patient leans: N/A  Psychiatric Specialty Exam: Physical Exam  Nursing note and vitals reviewed. Psychiatric: He has a normal mood and affect. His speech is normal and behavior is normal. Cognition and memory are normal. He expresses impulsivity. He expresses suicidal ideation.    Review of Systems  Musculoskeletal: Positive for joint pain.  Neurological: Negative.   Psychiatric/Behavioral: Positive for depression, substance abuse and suicidal ideas.  All other systems reviewed and are negative.   Blood pressure (!) 135/100, pulse 95, temperature 97.9 F (36.6 C), temperature source Oral, resp. rate 18, height 6\' 7"  (2.007 m), weight 103 kg (227 lb), SpO2 98 %.Body mass index is 25.57 kg/m.  General Appearance: Casual  Eye Contact:  Good  Speech:  Clear and Coherent  Volume:  Normal  Mood:  Anxious  Affect:  Appropriate  Thought Process:  Goal Directed and Descriptions of Associations: Intact  Orientation:  Full (Time, Place, and Person)  Thought Content:  WDL  Suicidal Thoughts:  Yes.  without intent/plan  Homicidal Thoughts:  No  Memory:  Immediate;   Fair Recent;   Fair Remote;   Fair  Judgement:  Impaired  Insight:  Shallow  Psychomotor Activity:  Normal  Concentration:  Concentration: Fair and Attention Span: Fair  Recall:  [employer] of Knowledge:  Fair  Language:  Fair  Akathisia:  No  Handed:  Right  AIMS (if indicated):     Assets:  Communication Skills Desire for Improvement Financial Resources/Insurance Resilience  ADL's:  Intact  Cognition:  WNL  Sleep:  Number of Hours: 7.25     Treatment Plan  Summary: Daily contact with patient to assess and evaluate symptoms and progress in treatment and Medication management   [name] is a 62 year old male with a history of depression admitted for suicidal ideation with a plan to cut himself in the context of severe social stressors and new medical problems. Mood is improving but still has passing suicidal ideation. Working of discharge plan.   #Suicidal ideation, reports passing suicidal ideation with no plan -patient is able to contract for safety in the hospital  #Mood,  -continue Celexa 20 mg  daily  #Insomnia, improved on current medication -Trazodone 100 mg  #Anxiety, improved on medication -Vistaril 50 mg TID PRN  #Foot pain, s/p ankle surgery 3 months ago in [place] -patient allowed to use ortho boot -prefers to use a wheelchair -PT input is greatly appretiated -Motrin 600 mg TID PRN -Neurontin 300 mg TID  #Alcohol abuse with episodic drinking -no symptoms of alcohol withdrawal, VS are stable  -minimizes problems and declines rehab  #Disposition -wishes to go to a boarding house in our area and has money saved up -SW will assist -follow up with [facility]     Kristine Linea, MD 02/26/2018, 4:51 AM

## 2018-02-26 NOTE — [facility] Group Notes (Signed)
[facility] Group Notes:  (Nursing/MHT/Case Management/Adjunct)  Date:  02/26/2018  Time:  3:10 PM  Type of Therapy:  Psychoeducational Skills  Participation Level:  Active  Participation Quality:  Appropriate  Affect:  Appropriate  Cognitive:  Appropriate  Insight:  Appropriate  Engagement in Group:  Engaged  Modes of Intervention:  Socialization  Summary of Progress/Problems:  Allen Travis 02/26/2018, 3:10 PM 

## 2018-02-26 NOTE — Plan of Care (Signed)
Patient pleasant and cooperative with steady gait. Reports that he slept good last night without the use of sleep aids. Good appetite. States that his concentration was good. Depression was an 8, hopelessness at a 5 and anxiety a 5. Leg pain a 6 but declined any pain medication when offered this morning. Ambulates via wheelchair, milieu remains safe with q 15 minute safety checks.

## 2018-02-26 NOTE — Plan of Care (Signed)
Patient is safe in the unit and contract for safety of self and others 15 minute safety check in progress , denies any SI/HI and no signs of AVH.

## 2018-02-27 MED ORDER — IBUPROFEN 600 MG PO TABS: 600.0000 mg | ORAL_TABLET | Freq: Four times a day (QID) | ORAL | 0 refills | Status: DC | PRN

## 2018-02-27 MED ORDER — CITALOPRAM HYDROBROMIDE 20 MG PO TABS: 20.0000 mg | ORAL_TABLET | Freq: Every day | ORAL | 1 refills | Status: DC

## 2018-02-27 MED ORDER — TRAZODONE HCL 100 MG PO TABS: 100.0000 mg | ORAL_TABLET | Freq: Every evening | ORAL | 1 refills | Status: DC | PRN

## 2018-02-27 MED ORDER — GABAPENTIN 300 MG PO CAPS: 300.0000 mg | ORAL_CAPSULE | Freq: Three times a day (TID) | ORAL | 1 refills | Status: AC

## 2018-02-27 MED ORDER — ADULT MULTIVITAMIN W/MINERALS CH: 1.0000 | ORAL_TABLET | Freq: Every day | ORAL | 1 refills | Status: AC

## 2018-02-27 MED ORDER — AMLODIPINE BESYLATE 5 MG PO TABS: 5.0000 mg | ORAL_TABLET | Freq: Every day | ORAL | 1 refills | Status: DC

## 2018-02-27 MED ORDER — HYDROXYZINE HCL 50 MG PO TABS: 50.0000 mg | ORAL_TABLET | Freq: Three times a day (TID) | ORAL | 1 refills | Status: DC | PRN

## 2018-02-27 MED ORDER — AMLODIPINE BESYLATE 5 MG PO TABS
5.0000 mg | ORAL_TABLET | Freq: Every day | ORAL | Status: DC
  Administered 2018-02-27 – 2018-03-01 (×3): 5 mg via ORAL
  Filled 2018-02-27 (×3): qty 1

## 2018-02-27 NOTE — Progress Notes (Addendum)
[facility]  02/27/2018 10:17 AM Allen Travis  MRN:  [id number]  Subjective:   Allen Travis is no longer suicidal or homicidal. Mood has improved, affects is brighter. He tolerates medications well. Complains of mild pain in the foot. He uses an orthopedic boot and a wheelchair here. He denies any legal charges yet, he has not been able to find a boarding house in the area to accept him. Anticipated discharge tomorrow. Blood pressure elevated.  Principal Problem: Major depressive disorder, recurrent severe without psychotic features (HCC) Diagnosis:   Patient Active Problem List   Diagnosis Date Noted  . Major depressive disorder, recurrent severe without psychotic features (HCC) [F33.2] 02/22/2018    Priority: High  . Alcohol use disorder, moderate, dependence (HCC) [F10.20] 02/23/2018  . Closed left ankle fracture, sequela [S82.892S] 02/23/2018  . PTSD (post-traumatic stress disorder) [F43.10] 02/23/2018  . Suicidal ideations [R45.851] 02/21/2018   Total Time spent with patient: 15 minutes  Past Psychiatric History: depression, substance abuse  Past Medical History:  Past Medical History:  Diagnosis Date  . Hypertension     Past Surgical History:  Procedure Laterality Date  . FRACTURE SURGERY     Family History: History reviewed. No pertinent family history. Family Psychiatric  History: mother with depression Social History:  Social History   Substance and Sexual Activity  Alcohol Use Yes     Social History   Substance and Sexual Activity  Drug Use Not on file    Social History   Socioeconomic History  . Marital status: Single    Spouse name: Not on file  . Number of children: Not on file  . Years of education: Not on file  . Highest education level: Not on file  Occupational History  . Not on file  Social Needs  . Financial resource strain: Not on file  . Food insecurity:    Worry: Not on file    Inability: Not on file  . Transportation needs:     Medical: Not on file    Non-medical: Not on file  Tobacco Use  . Smoking status: Current Some Day Smoker  . Smokeless tobacco: Never Used  Substance and Sexual Activity  . Alcohol use: Yes  . Drug use: Not on file  . Sexual activity: Not on file  Lifestyle  . Physical activity:    Days per week: Not on file    Minutes per session: Not on file  . Stress: Not on file  Relationships  . Social connections:    Talks on phone: Not on file    Gets together: Not on file    Attends religious service: Not on file    Active member of club or organization: Not on file    Attends meetings of clubs or organizations: Not on file    Relationship status: Not on file  Other Topics Concern  . Not on file  Social History Narrative  . Not on file   Additional Social History:                         Sleep: Fair  Appetite:  Fair  Current Medications: Current Facility-Administered Medications  Medication Dose Route Frequency Provider Last Rate Last Dose  . acetaminophen (TYLENOL) tablet 650 mg  650 mg Oral Q6H PRN Dartha Rozzell B, MD   650 mg at 02/26/18 2107  . alum & mag hydroxide-simeth (MAALOX/MYLANTA) 200-200-20 MG/5ML suspension 30 mL  30 mL Oral Q4H PRN  Jaydalynn Olivero B, MD      . amLODipine (NORVASC) tablet 5 mg  5 mg Oral Daily Horatio Bertz B, MD   5 mg at 02/27/18 1006  . citalopram (CELEXA) tablet 20 mg  20 mg Oral Daily Beverly Sessions, MD   20 mg at 02/27/18 4098  . gabapentin (NEURONTIN) capsule 300 mg  300 mg Oral TID Izaan Kingbird B, MD   300 mg at 02/27/18 0820  . hydrOXYzine (ATARAX/VISTARIL) tablet 50 mg  50 mg Oral TID PRN Mickel Schreur B, MD      . ibuprofen (ADVIL,MOTRIN) tablet 600 mg  600 mg Oral Q6H PRN Betsaida Missouri B, MD   600 mg at 02/27/18 0825  . multivitamin with minerals tablet 1 tablet  1 tablet Oral Daily Ayane Delancey B, MD   1 tablet at 02/27/18 0820  . traZODone (DESYREL) tablet 100 mg  100 mg Oral QHS  PRN Maks Cavallero B, MD   100 mg at 02/26/18 2107    Lab Results: No results found for this or any previous visit (from the past 48 hour(s)).  Blood Alcohol level:  Lab Results  Component Value Date   ETH <10 02/20/2018    Metabolic Disorder Labs: No results found for: HGBA1C, MPG No results found for: PROLACTIN No results found for: CHOL, TRIG, HDL, CHOLHDL, VLDL, LDLCALC  Physical Findings: AIMS:  , ,  ,  ,    CIWA:    COWS:     Musculoskeletal: Strength & Muscle Tone: within normal limits Gait & Station: normal Patient leans: N/A  Psychiatric Specialty Exam: Physical Exam  Nursing note and vitals reviewed. Psychiatric: He has a normal mood and affect. His speech is normal and behavior is normal. Thought content normal. Cognition and memory are normal. He expresses impulsivity.    Review of Systems  Musculoskeletal: Positive for joint pain.  Neurological: Negative.   Psychiatric/Behavioral: Positive for substance abuse.  All other systems reviewed and are negative.   Blood pressure (!) 167/92, pulse 97, temperature 97.7 F (36.5 C), temperature source Oral, resp. rate 16, height 6\' 7"  (2.007 m), weight 103 kg (227 lb), SpO2 99 %.Body mass index is 25.57 kg/m.  General Appearance: Casual  Eye Contact:  Good  Speech:  Clear and Coherent  Volume:  Normal  Mood:  Anxious  Affect:  Appropriate  Thought Process:  Goal Directed and Descriptions of Associations: Intact  Orientation:  Full (Time, Place, and Person)  Thought Content:  WDL  Suicidal Thoughts:  No  Homicidal Thoughts:  No  Memory:  Immediate;   Fair Recent;   Fair Remote;   Fair  Judgement:  Poor  Insight:  Shallow  Psychomotor Activity:  Normal  Concentration:  Concentration: Fair and Attention Span: Fair  Recall:  [employer] of Knowledge:  Fair  Language:  Fair  Akathisia:  No  Handed:  Right  AIMS (if indicated):     Assets:  Communication Skills Desire for Improvement Financial  Resources/Insurance Resilience  ADL's:  Intact  Cognition:  WNL  Sleep:  Number of Hours: 7.5     Treatment Plan Summary: Daily contact with patient to assess and evaluate symptoms and progress in treatment and Medication management   Allen Travis is a 62 year old male with a history of depression admitted for suicidal ideation with a plan to cut himself in the context of severe social stressors and new medical problems. Mood has improved, suicidal ideation resolved.    #Suicidal ideation, resolved -patient  is able to contract for safety in the hospital  #Mood, improved on current medications -continue Celexa 20 mg daily  #HTN, new problem -start Norvasc 5 mg daily  #Insomnia, improved on current medication -Trazodone 100 mg  #Anxiety, improved on medication -Vistaril 50 mg TID PRN  #Foot pain, s/p ankle surgery 3 months ago in [place] -patient allowed to use ortho boot -prefers to use a wheelchair -PT input is greatly appretiated -Motrin 600 mg TID PRN -Neurontin 300 mg TID -needs a cane at discharge  #Alcohol abuse with episodic drinking -no symptoms of alcohol withdrawal, VS are stable  -minimizes problems and declines rehab  #Disposition -wishes to go to a boarding house in our area and has money saved up but unable to find a room -SW will assist -follow up with [facility]    Kristine Linea, MD 02/27/2018, 10:17 AM

## 2018-02-27 NOTE — [facility] Group Notes (Signed)
02/27/2018 1PM  Type of Therapy/Topic:  Group Therapy:  Feelings about Diagnosis  Participation Level:  Did Not Attend   Description of Group:   This group will allow patients to explore their thoughts and feelings about diagnoses they have received. Patients will be guided to explore their level of understanding and acceptance of these diagnoses. Facilitator will encourage patients to process their thoughts and feelings about the reactions of others to their diagnosis and will guide patients in identifying ways to discuss their diagnosis with significant others in their lives. This group will be process-oriented, with patients participating in exploration of their own experiences, giving and receiving support, and processing challenge from other group members.   Therapeutic Goals: 1. Patient will demonstrate understanding of diagnosis as evidenced by identifying two or more symptoms of the disorder 2. Patient will be able to express two feelings regarding the diagnosis 3. Patient will demonstrate their ability to communicate their needs through discussion and/or role play  Summary of Patient Progress: Patient was encouraged and invited to attend group. Patient did not attend group. Social worker will continue to encourage group participation in the future.    Therapeutic Modalities:   Cognitive Behavioral Therapy Brief Therapy Feelings Identification    Johny ShearsCassandra  Jubilee Vivero, LCSW 02/27/2018 1:52 PM

## 2018-02-27 NOTE — [facility] Group Notes (Signed)
[facility] Group Notes:  (Nursing/MHT/Case Management/Adjunct)  Date:  02/27/2018  Time:  11:02 PM  Type of Therapy:  Group Therapy  Participation Level:  Active  Participation Quality:  Appropriate  Affect:  Appropriate  Cognitive:  Alert  Insight:  Good  Engagement in Group:  Engaged  Modes of Intervention:  Support  Summary of Progress/Problems:  Allen Travis 02/27/2018, 11:02 PM

## 2018-02-27 NOTE — Plan of Care (Signed)
Patient is feeling good about his progress denies any SI/HI voice no concerns, indicate his medicines and therapy is working for him ,contract for safety, reminded of 15 minute rounding is maintained, no distress noted. Problem: Education: Goal: Ability to make informed decisions regarding treatment will improve Outcome: Progressing   Problem: Coping: Goal: Coping ability will improve Outcome: Progressing   Problem: Health Behavior/Discharge Planning: Goal: Identification of resources available to assist in meeting health care needs will improve Outcome: Progressing   Problem: Medication: Goal: Compliance with prescribed medication regimen will improve Outcome: Progressing   Problem: Self-Concept: Goal: Ability to disclose and discuss suicidal ideas will improve Outcome: Progressing Goal: Will verbalize positive feelings about self Outcome: Progressing   Problem: Elimination: Goal: Will not experience complications related to bowel motility Outcome: Progressing   Problem: Pain Managment: Goal: General experience of comfort will improve Outcome: Progressing

## 2018-02-27 NOTE — Plan of Care (Signed)
Patient up ad lib ambulates in wheel chair. Wears leg brace for support to R) foot. Complained of pain to the R) foot, prn Ibuprofen administered with effective results. Compliant with medications and meals. Reports that he slept good last night with the help of sleep medication. Appetite and concentration is good and rates his depression a 5, hopelessness a 6 and anxiety a 7. Milieu remains safe with q 15 minute checks. BP elevated today, MD prescribed Norvasc, BP will be monitored.

## 2018-02-27 NOTE — Progress Notes (Signed)
Recreation Therapy Notes  Date: 02/27/2018  Time: 9:30 am   Location: Craft Room   Behavioral response: N/A   Intervention Topic: Coping Skills   Discussion/Intervention: Patient did not attend group.   Clinical Observations/Feedback:  Patient did not attend group.   Gianina Olinde LRT/CTRS        Allen Travis 02/27/2018 11:43 AM 

## 2018-02-27 NOTE — [facility] Group Notes (Signed)
  02/27/2018  Time: 0900  Type of Therapy and Topic: Group Therapy: Goals Group: SMART Goals   Participation Level:  Did Not Attend   Description of Group:   The purpose of a daily goals group is to assist and guide patients in setting recovery/wellness-related goals. The objective is to set goals as they relate to the crisis in which they were admitted. Patients will be using SMART goal modalities to set measurable goals. Characteristics of realistic goals will be discussed and patients will be assisted in setting and processing how one will reach their goal. Facilitator will also assist patients in applying interventions and coping skills learned in psycho-education groups to the SMART goal and process how one will achieve defined goal.   Therapeutic Goals:  -Patients will develop and document one goal related to or their crisis in which brought them into treatment.  -Patients will be guided by LCSW using SMART goal setting modality in how to set a measurable, attainable, realistic and time sensitive goal.  -Patients will process barriers in reaching goal.  -Patients will process interventions in how to overcome and successful in reaching goal.   Patient's Goal:  Pt was invited to attend group but chose not to attend. CSW will continue to encourage pt to attend group throughout their admission.   Therapeutic Modalities:  Motivational Interviewing  Cognitive Behavioral Therapy  Crisis Intervention Model  SMART goals setting   Heidi DachKelsey Amyriah Buras, MSW, LCSW Clinical Social Worker 02/27/2018 9:49 AM

## 2018-02-27 NOTE — Progress Notes (Signed)
Patient ID: Allen Travis, male   DOB: 1956-10-21, 62 y.o.   MRN: [id number] CSW met with PT, tried [facility] again and they finally answered saying they didn't have an opening at this time, but they will have one maybe next week.  CSW encouraged him to make a plan for where he would be going at discharge. He gives several reasons why he doesn't want to go outside of the area.  He verbalizes not wanting to go to the [employer] due to the costs.  CSW informed him that it would be important for him to  Make a decision as Pt would no longer be able to stay in the hospital once he doesn't meet criteria whether he has no where to go or not. He mentions that he would be okay to go to a shelter in [place] or [place].  CSW explains that it may not be possible for either as they do have residency requirements. He disagrees and says CSW is not right about that. CSW asks him to make his own plans overnight.   Allen Arbour, LCSW

## 2018-02-28 MED ORDER — PANTOPRAZOLE SODIUM 40 MG PO TBEC
40.0000 mg | DELAYED_RELEASE_TABLET | Freq: Every day | ORAL | Status: DC
  Administered 2018-02-28 – 2018-03-01 (×2): 40 mg via ORAL
  Filled 2018-02-28 (×2): qty 1

## 2018-02-28 MED ORDER — BISMUTH SUBSALICYLATE 262 MG PO CHEW
524.0000 mg | CHEWABLE_TABLET | ORAL | Status: DC | PRN
  Filled 2018-02-28 (×2): qty 2

## 2018-02-28 MED ORDER — BISMUTH SUBSALICYLATE 262 MG/15ML PO SUSP
30.0000 mL | ORAL | Status: DC | PRN
  Administered 2018-02-28 – 2018-03-01 (×2): 30 mL via ORAL
  Filled 2018-02-28: qty 118

## 2018-02-28 MED ORDER — PANTOPRAZOLE SODIUM 40 MG PO TBEC: 40.0000 mg | DELAYED_RELEASE_TABLET | Freq: Every day | ORAL | 1 refills | Status: DC

## 2018-02-28 NOTE — Progress Notes (Signed)
[facility] MD Progress Note  02/28/2018 7:17 PM Allen Travis  MRN:  [id number]  Subjective:  Allen Travis is not suicidal, homicidal, depressed, anxious or psychotic. He is not in alcohol withdrawals. He declines substance abuse treatment program participation. He were to be discharged to a boarding house today. There were no beds available. He is not allowed to the homeless shelter as he has income. Discharge tomorrow to a hotel.  Tolerates medications well. Complains of poor appetite, but requested double portions, Protonix added.  Principal Problem: Major depressive disorder, recurrent severe without psychotic features (HCC) Diagnosis:   Patient Active Problem List   Diagnosis Date Noted  . Major depressive disorder, recurrent severe without psychotic features (HCC) [F33.2] 02/22/2018    Priority: High  . Alcohol use disorder, moderate, dependence (HCC) [F10.20] 02/23/2018  . Closed left ankle fracture, sequela [S82.892S] 02/23/2018  . PTSD (post-traumatic stress disorder) [F43.10] 02/23/2018  . Suicidal ideations [R45.851] 02/21/2018   Total Time spent with patient: 15 minutes  Past Psychiatric History: depression, drinking.  Past Medical History:  Past Medical History:  Diagnosis Date  . Hypertension     Past Surgical History:  Procedure Laterality Date  . FRACTURE SURGERY     Family History: History reviewed. No pertinent family history. Family Psychiatric  History: see H&P Social History:  Social History   Substance and Sexual Activity  Alcohol Use Yes     Social History   Substance and Sexual Activity  Drug Use Not on file    Social History   Socioeconomic History  . Marital status: Single    Spouse name: Not on file  . Number of children: Not on file  . Years of education: Not on file  . Highest education level: Not on file  Occupational History  . Not on file  Social Needs  . Financial resource strain: Not on file  . Food insecurity:    Worry: Not on file     Inability: Not on file  . Transportation needs:    Medical: Not on file    Non-medical: Not on file  Tobacco Use  . Smoking status: Current Some Day Smoker  . Smokeless tobacco: Never Used  Substance and Sexual Activity  . Alcohol use: Yes  . Drug use: Not on file  . Sexual activity: Not on file  Lifestyle  . Physical activity:    Days per week: Not on file    Minutes per session: Not on file  . Stress: Not on file  Relationships  . Social connections:    Talks on phone: Not on file    Gets together: Not on file    Attends religious service: Not on file    Active member of club or organization: Not on file    Attends meetings of clubs or organizations: Not on file    Relationship status: Not on file  Other Topics Concern  . Not on file  Social History Narrative  . Not on file   Additional Social History:                         Sleep: Fair  Appetite:  Fair  Current Medications: Current Facility-Administered Medications  Medication Dose Route Frequency Provider Last Rate Last Dose  . acetaminophen (TYLENOL) tablet 650 mg  650 mg Oral Q6H PRN Jaiyanna Safran B, MD   650 mg at 02/26/18 2107  . amLODipine (NORVASC) tablet 5 mg  5 mg Oral Daily Kaitlan Bin  B, MD   5 mg at 02/28/18 0846  . bismuth subsalicylate (PEPTO BISMOL) chewable tablet 524 mg  524 mg Oral Q1H PRN Ellianah Cordy B, MD      . citalopram (CELEXA) tablet 20 mg  20 mg Oral Daily Beverly SessionsSubedi, Jagannath, MD   20 mg at 02/28/18 0846  . gabapentin (NEURONTIN) capsule 300 mg  300 mg Oral TID Capucine Tryon B, MD   300 mg at 02/28/18 1639  . hydrOXYzine (ATARAX/VISTARIL) tablet 50 mg  50 mg Oral TID PRN Jonita Hirota B, MD      . ibuprofen (ADVIL,MOTRIN) tablet 600 mg  600 mg Oral Q6H PRN Damaya Channing B, MD   600 mg at 02/27/18 0825  . multivitamin with minerals tablet 1 tablet  1 tablet Oral Daily Tyisha Cressy B, MD   1 tablet at 02/28/18 0846  . pantoprazole  (PROTONIX) EC tablet 40 mg  40 mg Oral Daily Conan Mcmanaway B, MD   40 mg at 02/28/18 1638  . traZODone (DESYREL) tablet 100 mg  100 mg Oral QHS PRN Zaela Graley B, MD   100 mg at 02/26/18 2107    Lab Results: No results found for this or any previous visit (from the past 48 hour(s)).  Blood Alcohol level:  Lab Results  Component Value Date   ETH <10 02/20/2018    Metabolic Disorder Labs: No results found for: HGBA1C, MPG No results found for: PROLACTIN No results found for: CHOL, TRIG, HDL, CHOLHDL, VLDL, LDLCALC  Physical Findings: AIMS:  , ,  ,  ,    CIWA:    COWS:     Musculoskeletal: Strength & Muscle Tone: within normal limits Gait & Station: normal Patient leans: N/A  Psychiatric Specialty Exam: Physical Exam  Nursing note and vitals reviewed. Psychiatric: He has a normal mood and affect. His speech is normal and behavior is normal. Thought content normal. Cognition and memory are normal. He expresses impulsivity.    Review of Systems  Neurological: Negative.   Psychiatric/Behavioral: Positive for substance abuse.  All other systems reviewed and are negative.   Blood pressure 122/69, pulse 71, temperature 98.7 F (37.1 C), temperature source Oral, resp. rate 16, height 6\' 7"  (2.007 m), weight 103 kg (227 lb), SpO2 94 %.Body mass index is 25.57 kg/m.  General Appearance: Casual  Eye Contact:  Good  Speech:  Clear and Coherent  Volume:  Normal  Mood:  Euthymic  Affect:  Appropriate  Thought Process:  Goal Directed and Descriptions of Associations: Intact  Orientation:  Full (Time, Place, and Person)  Thought Content:  WDL  Suicidal Thoughts:  No  Homicidal Thoughts:  No  Memory:  Immediate;   Fair Recent;   Fair Remote;   Fair  Judgement:  Poor  Insight:  Lacking  Psychomotor Activity:  Normal  Concentration:  Concentration: Fair and Attention Span: Fair  Recall:  [employer]  Fund of Knowledge:  Fair  Language:  Fair  Akathisia:  No  Handed:   Right  AIMS (if indicated):     Assets:  Communication Skills Desire for Improvement Financial Resources/Insurance Resilience  ADL's:  Intact  Cognition:  WNL  Sleep:  Number of Hours: 6     Treatment Plan Summary: Daily contact with patient to assess and evaluate symptoms and progress in treatment and Medication management   Allen Travis is a 62 year old male with a history of depression admitted for suicidal ideation with a plan to cut himself in the context of severe  social stressors and new medical problems.Mood has improved, suicidal ideation resolved.   #Suicidal ideation,resolved -patient is able to contract for safety in the hospital  #Mood,improved on current medications -continue Celexa 20 mg daily  #HTN, new problem, BP is normal -start Norvasc 5 mg daily  #Insomnia, improved on current medication -Trazodone 100 mg  #Anxiety,improved on medication -Vistaril 50 mg TID PRN  #Foot pain, s/p ankle surgery21months ago in [place] -patient allowed to use ortho boot -prefers to use a wheelchair -PT input is greatly appretiated -Motrin 600 mg TID PRN -Neurontin 300 mg TID -needs a cane at discharge  #Alcohol abusewith episodic drinking -no symptoms of alcohol withdrawal, VS are stable -minimizes problems and declines rehab  #Disposition -no beds available at [facility] houses, discharge to a hotel -follow up with [facility]    Kristine Linea, MD 02/28/2018, 7:17 PM

## 2018-02-28 NOTE — Progress Notes (Signed)
Received Allen Travis this AM after breakfast, he was compliant wit his medications. He continues to endorse a little anxiety and depression with intervals of racing thoughts and voices. He remains in a wheelchair to prevent weight bearing on his right foot. His goal for today is to mentally and physically get back on the right tract. He is OOB in the milieu at intervals and socializing with select peers. No changed in his mental status this PM. A cane was ordered to assist him home and place in the nurses station.
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
## 2018-02-28 NOTE — Progress Notes (Signed)
Recreation Therapy Notes  Date: 02/28/2018  Time: 9:30 am   Location: Craft Room   Behavioral response: N/A   Intervention Topic: Communication    Discussion/Intervention: Patient did not attend group.   Clinical Observations/Feedback:  Patient did not attend group.   [name] LRT/CTRS        Allen Travis 02/28/2018 10:26 AM 

## 2018-02-28 NOTE — [facility] Group Notes (Signed)
LCSW Group Therapy Note  02/28/2018 1:00 pm  Type of Therapy/Topic:  Group Therapy:  Emotion Regulation  Participation Level:  Did Not Attend   Description of Group:    The purpose of this group is to assist patients in learning to regulate negative emotions and experience positive emotions. Patients will be guided to discuss ways in which they have been vulnerable to their negative emotions. These vulnerabilities will be juxtaposed with experiences of positive emotions or situations, and patients will be challenged to use positive emotions to combat negative ones. Special emphasis will be placed on coping with negative emotions in conflict situations, and patients will process healthy conflict resolution skills.  Therapeutic Goals: 1. Patient will identify two positive emotions or experiences to reflect on in order to balance out negative emotions 2. Patient will label two or more emotions that they find the most difficult to experience 3. Patient will demonstrate positive conflict resolution skills through discussion and/or role plays  Summary of Patient Progress: Leonette MostCharles was invited to today's group, but chose not to attend.      Therapeutic Modalities:   Cognitive Behavioral Therapy Feelings Identification Dialectical Behavioral Therapy

## 2018-02-28 NOTE — Progress Notes (Signed)
Patient ID: Allen SilenceCharles Travis, male   DOB: 12/26/1955, 62 y.o.   MRN: [id number] CSW contacted [name] at [phone] regarding an open bed in one of his boarding houses.  [name] shared that he would have to speak with his staff about an opening and would f/u with CSW at 1:00 PM with an answer.  CSW informed that pt would have to obtain his own bed and that rent would be $400/month in his [place] home and [place]500/month for his [place] home.  This information was shared with pt.  Pt informed that the other option would be for him to d/c to a hotel [facility]  CSW attempted to contact [name] again, but had to leave a VM.  Pt will be informed about having to d/c to motel with appropriate follow-up services put in place.  CSW informed pt's psychiatrist.

## 2018-02-28 NOTE — [facility] Suicide Risk Assessment (Addendum)
[facility]   Principal Problem: Major depressive disorder, recurrent severe without psychotic features [facility](HCC) Discharge Diagnoses:  Patient Active Problem List   Diagnosis Date Noted  . Major depressive disorder, recurrent severe without psychotic features (HCC) [F33.2] 02/22/2018    Priority: High  . Alcohol use disorder, moderate, dependence (HCC) [F10.20] 02/23/2018  . Closed left ankle fracture, sequela [S82.892S] 02/23/2018  . PTSD (post-traumatic stress disorder) [F43.10] 02/23/2018  . Suicidal ideations [R45.851] 02/21/2018    Total Time spent with patient: 15 minutes plus 20 min on care coordination and documentation  Musculoskeletal: Strength & Muscle Tone: within normal limits Gait & Station: normal Patient leans: N/A  Psychiatric Specialty Exam: Review of Systems  Musculoskeletal: Positive for joint pain.  Neurological: Negative.   Psychiatric/Behavioral: Positive for substance abuse.  All other systems reviewed and are negative.   Blood pressure 122/69, pulse 71, temperature 98.7 F (37.1 C), temperature source Oral, resp. rate 16, height 6\' 7"  (2.007 m), weight 103 kg (227 lb), SpO2 94 %.Body mass index is 25.57 kg/m.  General Appearance: Casual  Eye Contact::  Good  Speech:  Clear and Coherent409  Volume:  Normal  Mood:  Euthymic  Affect:  Appropriate  Thought Process:  Goal Directed and Descriptions of Associations: Intact  Orientation:  Full (Time, Place, and Person)  Thought Content:  WDL  Suicidal Thoughts:  No  Homicidal Thoughts:  No  Memory:  Immediate;   Fair Recent;   Fair Remote;   Fair  Judgement:  Poor  Insight:  Shallow  Psychomotor Activity:  Normal  Concentration:  Fair  Recall:  [employer]  Fund of Knowledge:Fair  Language: Fair  Akathisia:  No  Handed:  Right  AIMS (if indicated):     Assets:  Communication Skills Desire for Improvement Financial Resources/Insurance Resilience Social Support  Sleep:   Number of Hours: 6  Cognition: WNL  ADL's:  Intact   Mental Status Per Nursing Assessment::   On Admission:     Demographic Factors:  Male and Low socioeconomic status  Loss Factors: Decline in physical health and Financial problems/change in socioeconomic status  Historical Factors: Prior suicide attempts, Family history of mental illness or substance abuse and Impulsivity  Risk Reduction Factors:   Sense of responsibility to family and Positive social support  Continued Clinical Symptoms:  Depression:   Comorbid alcohol abuse/dependence Impulsivity Alcohol/Substance Abuse/Dependencies Medical Diagnoses and Treatments/Surgeries  Cognitive Features That Contribute To Risk:  None    Suicide Risk:  Minimal: No identifiable suicidal ideation.  Patients presenting with no risk factors but with morbid ruminations; may be classified as minimal risk based on the severity of the depressive symptoms    Plan Of Care/Follow-up recommendations:  Activity:  as tolerated Diet:  low sodium heart healthy Other:  keep follow up appointments  Kristine LineaJolanta Glessie Eustice, MD 02/28/2018, 1:50 PM

## 2018-02-28 NOTE — Plan of Care (Signed)
Patient states that he is doing  well, voice no concerns , contract for safety of self and others, denies any thoughts of suicide ideations and no signs of AVH ,  15 minute rounding for safety is in progress, no distress Problem: Education: Goal: Ability to make informed decisions regarding treatment will improve Outcome: Progressing   Problem: Coping: Goal: Coping ability will improve Outcome: Progressing   Problem: Health Behavior/Discharge Planning: Goal: Identification of resources available to assist in meeting health care needs will improve Outcome: Progressing   Problem: Medication: Goal: Compliance with prescribed medication regimen will improve Outcome: Progressing   Problem: Self-Concept: Goal: Ability to disclose and discuss suicidal ideas will improve Outcome: Progressing Goal: Will verbalize positive feelings about self Outcome: Progressing   Problem: Elimination: Goal: Will not experience complications related to bowel motility Outcome: Progressing   Problem: Pain Managment: Goal: General experience of comfort will improve Outcome: Progressing   noted

## 2018-02-28 NOTE — Tx Team (Signed)
Interdisciplinary Treatment and Diagnostic Plan Update  02/28/2018 Time of Session: 10:40 AM Allen Travis MRN: [id number]  Principal Diagnosis: Major depressive disorder, recurrent severe without psychotic features (HCC)  Secondary Diagnoses: Principal Problem:   Major depressive disorder, recurrent severe without psychotic features (HCC) Active Problems:   Alcohol use disorder, moderate, dependence (HCC)   Closed left ankle fracture, sequela   PTSD (post-traumatic stress disorder)   Current Medications:  Current Facility-Administered Medications  Medication Dose Route Frequency Provider Last Rate Last Dose  . acetaminophen (TYLENOL) tablet 650 mg  650 mg Oral Q6H PRN Pucilowska, Jolanta B, MD   650 mg at 02/26/18 2107  . amLODipine (NORVASC) tablet 5 mg  5 mg Oral Daily Pucilowska, Jolanta B, MD   5 mg at 02/28/18 0846  . bismuth subsalicylate (PEPTO BISMOL) chewable tablet 524 mg  524 mg Oral Q1H PRN Pucilowska, Jolanta B, MD      . citalopram (CELEXA) tablet 20 mg  20 mg Oral Daily Beverly Sessions, MD   20 mg at 02/28/18 0846  . gabapentin (NEURONTIN) capsule 300 mg  300 mg Oral TID Pucilowska, Jolanta B, MD   300 mg at 02/28/18 1639  . hydrOXYzine (ATARAX/VISTARIL) tablet 50 mg  50 mg Oral TID PRN Pucilowska, Jolanta B, MD      . ibuprofen (ADVIL,MOTRIN) tablet 600 mg  600 mg Oral Q6H PRN Pucilowska, Jolanta B, MD   600 mg at 02/27/18 0825  . multivitamin with minerals tablet 1 tablet  1 tablet Oral Daily Pucilowska, Jolanta B, MD   1 tablet at 02/28/18 0846  . pantoprazole (PROTONIX) EC tablet 40 mg  40 mg Oral Daily Pucilowska, Jolanta B, MD   40 mg at 02/28/18 1638  . traZODone (DESYREL) tablet 100 mg  100 mg Oral QHS PRN Pucilowska, Jolanta B, MD   100 mg at 02/26/18 2107   PTA Medications: Medications Prior to Admission  Medication Sig Dispense Refill Last Dose  . acetaminophen (TYLENOL) 500 MG tablet Take 500 mg by mouth every 6 (six) hours as needed for moderate pain.    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   Physician Treatment Plan for Primary Diagnosis: Major depressive disorder, recurrent severe without psychotic features (HCC) Long Term Goal(s): Improvement in symptoms so as ready for discharge Improvement in symptoms so as ready for discharge   Short Term Goals: Ability to identify changes in lifestyle to reduce recurrence of condition will improve Ability to verbalize feelings will improve Ability to disclose and discuss suicidal ideas Ability to demonstrate self-control will improve Ability to identify and develop effective coping behaviors will improve Ability to maintain clinical measurements within normal limits will improve Compliance with prescribed medications will improve Ability to identify triggers associated with substance abuse/mental health issues will improve Ability to identify changes in lifestyle to reduce recurrence of condition will improve Ability to demonstrate self-control will improve Ability to identify triggers associated with substance abuse/mental health issues will improve  Medication Management: Evaluate patient's response, side effects, and tolerance of medication regimen.  Therapeutic Interventions: 1 to 1 sessions, Unit Group sessions and Medication administration.  Evaluation of Outcomes: Progressing  Physician Treatment Plan for Secondary Diagnosis: Principal Problem:   Major depressive  disorder, recurrent severe without psychotic features (HCC) Active Problems:   Alcohol use disorder, moderate, dependence (HCC)   Closed left ankle  fracture, sequela   PTSD (post-traumatic stress disorder)  Long Term Goal(s): Improvement in symptoms so as ready for discharge Improvement in symptoms so as ready for discharge   Short Term Goals: Ability to identify changes in lifestyle to reduce recurrence of condition will improve Ability to verbalize feelings will improve Ability to disclose and discuss suicidal ideas Ability to demonstrate self-control will improve Ability to identify and develop effective coping behaviors will improve Ability to maintain clinical measurements within normal limits will improve Compliance with prescribed medications will improve Ability to identify triggers associated with substance abuse/mental health issues will improve Ability to identify changes in lifestyle to reduce recurrence of condition will improve Ability to demonstrate self-control will improve Ability to identify triggers associated with substance abuse/mental health issues will improve     Medication Management: Evaluate patient's response, side effects, and tolerance of medication regimen.  Therapeutic Interventions: 1 to 1 sessions, Unit Group sessions and Medication administration.  Evaluation of Outcomes: Progressing   RN Treatment Plan for Primary Diagnosis: Major depressive disorder, recurrent severe without psychotic features (HCC) Long Term Goal(s): Knowledge of disease and therapeutic regimen to maintain health will improve  Short Term Goals: Ability to demonstrate self-control, Ability to identify and develop effective coping behaviors will improve and Compliance with prescribed medications will improve  Medication Management: RN will administer medications as ordered by provider, will assess and evaluate patient's response and provide education to patient for prescribed medication. RN will report any adverse and/or side effects to prescribing provider.  Therapeutic Interventions: 1 on 1 counseling sessions,  Psychoeducation, Medication administration, Evaluate responses to treatment, Monitor vital signs and CBGs as ordered, Perform/monitor CIWA, COWS, AIMS and Fall Risk screenings as ordered, Perform wound care treatments as ordered.  Evaluation of Outcomes: Progressing   LCSW Treatment Plan for Primary Diagnosis: Major depressive disorder, recurrent severe without psychotic features (HCC) Long Term Goal(s): Safe transition to appropriate next level of care at discharge, Engage patient in therapeutic group addressing interpersonal concerns.  Short Term Goals: Engage patient in aftercare planning with referrals and resources and Increase skills for wellness and recovery  Therapeutic Interventions: Assess for all discharge needs, 1 to 1 time with Social worker, Explore available resources and support systems, Assess for adequacy in community support network, Educate family and significant other(s) on suicide prevention, Complete Psychosocial Assessment, Interpersonal group therapy.  Evaluation of Outcomes: Progressing   Progress in Treatment: Attending groups: No. Participating in groups: No. Taking medication as prescribed: Yes. Toleration medication: Yes. Family/Significant other contact made: No, will contact:  Pt did not identify or give consent for contact of a support person. Patient understands diagnosis: Yes. Discussing patient identified problems/goals with staff: Yes. Medical problems stabilized or resolved: Yes. Denies suicidal/homicidal ideation: Yes. Issues/concerns per patient self-inventory: No. Other: n/a  New problem(s) identified: No, Describe:  No new problems identified  New Short Term/Long Term Goal(s): Pt stated his goal for this hospitalization is "get back on my feet. Learn to cope with things better and get back on my meds"  Discharge Plan or Barriers: Allen Travis will be discharged to a motel or a boarding house with follow-up outpatient services in the community in  which he will be residing.  Allen Travis will have to apply for Section 8 or Senior Living Housing when he is discharged from the hospital.  Reason for Continuation of Hospitalization: Depression  Medication stabilization  Estimated Length of Stay: 1-2 days  Recreational Therapy: Patient Stressors: Broken Leg Patient Goal: Patient will identify 3 positive coping skills strategies to use post d/c within 5 recreation therapy group sessions  Attendees: Patient:  02/28/2018 4:50 PM  Physician: Kristine Linea, MD 02/28/2018 4:50 PM  Nursing: Hulan Amato, RN 02/28/2018 4:50 PM  RN Care Manager: 02/28/2018 4:50 PM  Social Worker: Huey Romans, LCSW 02/28/2018 4:50 PM  Recreational Therapist: Garret Reddish, LRT 02/28/2018 4:50 PM  Other: Rodena Goldmann, LCSWA 02/28/2018 4:50 PM  Other: Heidi Dach, LCSW 02/28/2018 4:50 PM  Other: 02/28/2018 4:50 PM    Scribe for Treatment Team: Alease Frame, LCSW 02/28/2018 4:50 PM

## 2018-03-01 NOTE — Progress Notes (Signed)
Recreation Therapy Notes  INPATIENT RECREATION TR PLAN  Patient Details Name: Allen Travis MRN: [id number] DOB: 1956-01-28 Today's Date: 03/01/2018  Rec Therapy Plan Is patient appropriate for Therapeutic Recreation?: Yes Treatment times per week: at least 3 Estimated Length of Stay: 5-7 days TR Treatment/Interventions: Group participation (Comment)  Discharge Criteria Pt will be discharged from therapy if:: Discharged Treatment plan/goals/alternatives discussed and agreed upon by:: Patient/family  Discharge Summary Short term goals set: Patient will identify 3 positive coping skills strategies to use post d/c within 5 recreation therapy group sessions Short term goals met: Not met Progress toward goals comments: Groups attended Which groups?: Stress management Reason goals not met: Patient spent most of his time in his room Therapeutic equipment acquired: N/A Reason patient discharged from therapy: Discharge from hospital Pt/family agrees with progress & goals achieved: Yes Date patient discharged from therapy: 03/01/18   Allen Travis 03/01/2018, 2:47 PM

## 2018-03-01 NOTE — Progress Notes (Signed)
Received Cire this AM after breakfast, he was compliant with his medications. He stated feeling better today and feels safe for discharge home. His AVS was reviewed and questions answered, his personal belongings returned. He received his walking cane. He was transported to the desired address via taxi without incident.

## 2018-03-01 NOTE — Care Management (Signed)
03/01/18- last note entry. CM provided unit clerk with donated Quad cane for patient to take home.

## 2018-03-01 NOTE — Progress Notes (Signed)
Patient ID: Allen Travis, male   DOB: [birth year], 62 y.o.   MRN: [id number] CSW met with pt again to finalize his d/c plans.  Pt shared that he did not wish to go to a hotel due to the cost and would rather be d/c to a shelter.  Pt shared that he would like to go back to the [place], [place] area so he can get his belongings out of the storage unit.  The plan is for pt to go via taxi to his storage unit in [place], [place] then to a local shelter. CSW gave pt information for the [facility] as well as the [facility].  Pt was also given the contact information for the [facility] which pt can utilize for case management, outreach, and receive his mail.  Pt's hospital d/c appointment was scheduled with [facility] in [place], [place] for 03/02/18.

## 2018-03-01 NOTE — BHH Group Notes (Signed)
LCSW Group Therapy Note 03/01/2018 9:00 AM  Type of Therapy and Topic:  Group Therapy:  Setting Goals  Participation Level:  None  Description of Group: In this process group, patients discussed using strengths to work toward goals and address challenges.  Patients identified two positive things about themselves and one goal they were working on.  Patients were given the opportunity to share openly and support each other's plan for self-empowerment.  The group discussed the value of gratitude and were encouraged to have a daily reflection of positive characteristics or circumstances.  Patients were encouraged to identify a plan to utilize their strengths to work on current challenges and goals.  Therapeutic Goals 1. Patient will verbalize personal strengths/positive qualities and relate how these can assist with achieving desired personal goals 2. Patients will verbalize affirmation of peers plans for personal change and goal setting 3. Patients will explore the value of gratitude and positive focus as related to successful achievement of goals 4. Patients will verbalize a plan for regular reinforcement of personal positive qualities and circumstances.  Summary of Patient Progress: Allen Travis initially came into group, but did not stay, He was in group approximately 5 minutes.      Therapeutic Modalities Cognitive Behavioral Therapy Motivational Interviewing    Alease FrameSonya S [place], [place] 03/01/2018 3:06 PM

## 2018-03-01 NOTE — Progress Notes (Signed)
  [facility] :  Will you be returning to the same living situation after discharge:  No. At discharge, do you have transportation home?: Yes,  Pt will [place] via [place] area. Do you have the ability to pay for your medications: Yes,  Pt has medical insurance and receives SSI benefits.  Release of information consent forms completed and in the chart;  Patient's signature needed at discharge.  Patient to Follow up at: Follow-up Information    [facility] Follow up on 03/02/2018.   Why:  Your follow-up appointment is scheduled for 03/02/18 at 8:30AM with Jacqualyn Poseyorothy Corbin. Please bring your ID and insurance card.  [facility] [address] [place], [place]  [address] [phone] phone  Contact information: [address]  [place] [address] [phone]           Next level of care provider has access to [facility] Link:no  Safety Planning and Suicide Prevention discussed: Yes,  No issues identified.  Have you used any form of tobacco in the last 30 days? (Cigarettes, Smokeless Tobacco, Cigars, and/or Pipes): No  Has patient been referred to the Quitline?: Patient refused referral  Patient has been referred for addiction treatment: N/A  Alease FrameSonya S [place], LCSW 03/01/2018, 10:38 AM

## 2018-03-01 NOTE — BHH Group Notes (Signed)
03/01/2018  Time: 1:00PM  Type of Therapy/Topic:  Group Therapy:  Balance in Life  Participation Level:  Did Not Attend  Description of Group:   This group will address the concept of balance and how it feels and looks when one is unbalanced. Patients will be encouraged to process areas in their lives that are out of balance and identify reasons for remaining unbalanced. Facilitators will guide patients in utilizing problem-solving interventions to address and correct the stressor making their life unbalanced. Understanding and applying boundaries will be explored and addressed for obtaining and maintaining a balanced life. Patients will be encouraged to explore ways to assertively make their unbalanced needs known to significant others in their lives, using other group members and facilitator for support and feedback.  Therapeutic Goals: 1. Patient will identify two or more emotions or situations they have that consume much of in their lives. 2. Patient will identify signs/triggers that life has become out of balance:  3. Patient will identify two ways to set boundaries in order to achieve balance in their lives:  4. Patient will demonstrate ability to communicate their needs through discussion and/or role plays  Summary of Patient Progress: Pt was invited to attend group but chose not to attend. CSW will continue to encourage pt to attend group throughout their admission.   Therapeutic Modalities:   Cognitive Behavioral Therapy Solution-Focused Therapy Assertiveness Training  Heidi DachKelsey Konner Warrior, MSW, LCSW Clinical Social Worker 03/01/2018 1:54 PM

## 2018-03-01 NOTE — Progress Notes (Signed)
Recreation Therapy Notes  Date: 03/01/2018  Time: 9:30 am   Location: Craft Room   Behavioral response: N/A   Intervention Topic: Creative Expressions    Discussion/Intervention: Patient did not attend group.   Clinical Observations/Feedback:  Patient did not attend group.   [name] LRT/CTRS        Allen Travis 03/01/2018 12:20 PM 

## 2018-04-13 ENCOUNTER — Other Ambulatory Visit: Payer: Self-pay

## 2018-04-13 ENCOUNTER — Emergency Department (HOSPITAL_COMMUNITY)
Admission: EM | Admit: 2018-04-13 | Discharge: 2018-04-13 | Disposition: A | Discharge status: Home / Self Care | Payer: Medicaid Other | Attending: Emergency Medicine | Admitting: Emergency Medicine

## 2018-04-13 ENCOUNTER — Encounter (HOSPITAL_COMMUNITY): Payer: Self-pay

## 2018-04-13 DIAGNOSIS — Z9114 Patient's other noncompliance with medication regimen: Secondary | ICD-10-CM | POA: Diagnosis not present

## 2018-04-13 DIAGNOSIS — I10 Essential (primary) hypertension: Secondary | ICD-10-CM | POA: Insufficient documentation

## 2018-04-13 DIAGNOSIS — Z79899 Other long term (current) drug therapy: Secondary | ICD-10-CM | POA: Insufficient documentation

## 2018-04-13 DIAGNOSIS — Z87891 Personal history of nicotine dependence: Secondary | ICD-10-CM | POA: Insufficient documentation

## 2018-04-13 DIAGNOSIS — F419 Anxiety disorder, unspecified: Secondary | ICD-10-CM | POA: Diagnosis present

## 2018-04-13 HISTORY — DX: Anxiety disorder, unspecified: F41.9

## 2018-04-13 MED ORDER — AMLODIPINE BESYLATE 5 MG PO TABS
5.0000 mg | ORAL_TABLET | Freq: Every day | ORAL | Status: DC
  Administered 2018-04-13: 5 mg via ORAL
  Filled 2018-04-13: qty 1

## 2018-04-13 MED ORDER — CITALOPRAM HYDROBROMIDE 10 MG PO TABS
20.0000 mg | ORAL_TABLET | Freq: Every day | ORAL | Status: DC
  Administered 2018-04-13: 20 mg via ORAL
  Filled 2018-04-13: qty 2

## 2018-04-13 NOTE — Care Management Note (Signed)
Case Management Note  CM spoke with pt about establishing a PCP.  Pt chose the [facility].  CM also discussed using the pharmacy at the [facility] when he has difficulty playing for his medications.  Pt advised that he gets "paid tomorrow."  CM advised him that he can go to any pharmacy with Medicaid to get his prescriptions tomorrow.  Lauro Regulus, PA.  Information on AVS.  No further CM needs noted at this time.

## 2018-04-13 NOTE — ED Notes (Signed)
Bed: WTR5 Expected date:  Expected time:  Means of arrival:  Comments: 

## 2018-04-13 NOTE — Discharge Instructions (Addendum)
Case management met with you today and explained different options to obtain medications.  Refill your prescriptions as soon as you are able to.  °

## 2018-04-13 NOTE — ED Notes (Signed)
Bed: WTR8 Expected date:  Expected time:  Means of arrival:  Comments: 

## 2018-04-13 NOTE — ED Provider Notes (Signed)
[facility] DEPT Provider Note   CSN: [id number] Arrival date & time: 04/13/18  1103     History   Chief Complaint Chief Complaint  Patient presents with  . Anxiety    HPI Allen Travis is a 62 y.o. male here for difficulty obtaining medications. States he was discharged from hospital with prescriptions but has been unable to obtain them. He was homeless but now has housing, states he does not know where he can go to obtain his medications at a good price. He states he has no money. He is particularly concerned about his blood pressure and "stress" medicines.  He was admitted for SI and discharged on 4/18. He has his prescriptions for celexa, norvasc, trazadone, vistaril, neurontin and motrin with him.  He denies CP, SOB, headache, abdominal pain, SI, HI, AVH.   HPI  Past Medical History:  Diagnosis Date  . Anxiety   . Hypertension     Patient Active Problem List   Diagnosis Date Noted  . Alcohol use disorder, moderate, dependence ([place]) 02/23/2018  . Closed left ankle fracture, sequela 02/23/2018  . PTSD (post-traumatic stress disorder) 02/23/2018  . Major depressive disorder, recurrent severe without psychotic features ([place]) 02/22/2018  . Suicidal ideations 02/21/2018    Past Surgical History:  Procedure Laterality Date  . FRACTURE SURGERY          Home Medications    Prior to Admission medications   Medication Sig Start Date End Date Taking? Authorizing Provider  acetaminophen (TYLENOL) 500 MG tablet Take 500 mg by mouth every 6 (six) hours as needed for moderate pain.   Yes [provider]  amLODipine (NORVASC) 5 MG tablet Take 1 tablet (5 mg total) by mouth daily. 02/28/18   Pucilowska, Herma Ard B, MD  citalopram (CELEXA) 20 MG tablet Take 1 tablet (20 mg total) by mouth daily. 02/28/18   Pucilowska, Herma Ard B, MD  gabapentin (NEURONTIN) 300 MG capsule Take 1 capsule (300 mg total) by mouth 3 (three) times daily. 02/27/18    Pucilowska, Herma Ard B, MD  hydrOXYzine (ATARAX/VISTARIL) 50 MG tablet Take 1 tablet (50 mg total) by mouth 3 (three) times daily as needed for anxiety. 02/27/18   Pucilowska, Herma Ard B, MD  ibuprofen (ADVIL,MOTRIN) 600 MG tablet Take 1 tablet (600 mg total) by mouth every 6 (six) hours as needed for moderate pain. 02/27/18   Pucilowska, Wardell Honour, MD  Multiple Vitamin (MULTIVITAMIN WITH MINERALS) TABS tablet Take 1 tablet by mouth daily. 02/28/18   Pucilowska, Jolanta B, MD  pantoprazole (PROTONIX) 40 MG tablet Take 1 tablet (40 mg total) by mouth daily. 02/28/18   Pucilowska, Herma Ard B, MD  traZODone (DESYREL) 100 MG tablet Take 1 tablet (100 mg total) by mouth at bedtime as needed for sleep. 02/27/18   Clovis Fredrickson, MD    Family History History reviewed. No pertinent family history.  Social History Social History   Tobacco Use  . Smoking status: Former Research scientist (life sciences)  . Smokeless tobacco: Never Used  Substance Use Topics  . Alcohol use: Yes    Comment: daily use  . Drug use: Never     Allergies   Patient has no known allergies.   Review of Systems Review of Systems  All other systems reviewed and are negative.    Physical Exam Updated Vital Signs BP (!) 153/105 (BP Location: Left Arm)   Pulse 85   Temp 97.6 F (36.4 C) (Oral)   Resp 16   Ht '6\' 7"'  (2.007 m)  Wt 108.9 kg (240 lb)   SpO2 98%   BMI 27.04 kg/m   Physical Exam  Constitutional: He is oriented to person, place, and time. He appears well-developed and well-nourished.  Non-toxic appearance.  HENT:  Head: Normocephalic.  Right Ear: External ear normal.  Left Ear: External ear normal.  Nose: Nose normal.  Eyes: Conjunctivae and EOM are normal.  Neck: Full passive range of motion without pain.  Cardiovascular: Normal rate and normal heart sounds.  Pulmonary/Chest: Effort normal and breath sounds normal. No tachypnea.  Musculoskeletal: Normal range of motion.  Neurological: He is alert and oriented to  person, place, and time.  Skin: Skin is warm and dry. Capillary refill takes less than 2 seconds.  Psychiatric: His behavior is normal. Thought content normal.  Denies HI, HI, AVH      ED Treatments / Results  Labs (all labs ordered are listed, but only abnormal results are displayed) Labs Reviewed - No data to display  EKG None  Radiology No results found.  Procedures Procedures (including critical care time)  Medications Ordered in ED Medications  amLODipine (NORVASC) tablet 5 mg (has no administration in time range)  citalopram (CELEXA) tablet 20 mg (has no administration in time range)     Initial Impression / Assessment and Plan / ED Course  I have reviewed the triage vital signs and the nursing notes.  Pertinent labs & imaging results that were available during my care of the patient were reviewed by me and considered in my medical decision making (see chart for details).     Spoke to [employer] who met with patient. Pt now has housing. He gets paid tomorrow 6/1. He already has his prescriptions. Levada Dy provided patient with different options to obtain medications tomorrow. Pt states he will be able to afford his medications tomorrow after he gets paid, his medicaid is active. We will give him his norvasc and celexa in ER. Discussed return precautions. [facility]   Final Clinical Impressions(s) / ED Diagnoses   Final diagnoses:  Difficulty obtaining medication    ED Discharge Orders    None       Arlean Hopping 04/13/18 1255    Tanna Furry, MD 04/14/18 1818

## 2018-04-13 NOTE — ED Triage Notes (Signed)
Patient c/o anxiety and states "I need some stress medicine." Patient ha d several prescriptions from [facility] from April 2019 that he did not get filled and stated he could not get them filled because he did not have housing at the time. Patient states he has housing now and has a lot of stress and anxiety.

## 2018-05-07 ENCOUNTER — Ambulatory Visit (INDEPENDENT_AMBULATORY_CARE_PROVIDER_SITE_OTHER): Payer: Medicaid Other | Admitting: Family Medicine

## 2018-05-07 ENCOUNTER — Encounter: Payer: Self-pay | Admitting: Family Medicine

## 2018-05-07 VITALS — BP 152/94 | HR 83 | Temp 98.0°F | Resp 16 | Ht 79.0 in | Wt 231.0 lb

## 2018-05-07 DIAGNOSIS — Z1159 Encounter for screening for other viral diseases: Secondary | ICD-10-CM

## 2018-05-07 DIAGNOSIS — Z114 Encounter for screening for human immunodeficiency virus [HIV]: Secondary | ICD-10-CM | POA: Diagnosis not present

## 2018-05-07 DIAGNOSIS — I10 Essential (primary) hypertension: Secondary | ICD-10-CM

## 2018-05-07 DIAGNOSIS — F332 Major depressive disorder, recurrent severe without psychotic features: Secondary | ICD-10-CM

## 2018-05-07 DIAGNOSIS — F411 Generalized anxiety disorder: Secondary | ICD-10-CM | POA: Diagnosis not present

## 2018-05-07 DIAGNOSIS — Z1211 Encounter for screening for malignant neoplasm of colon: Secondary | ICD-10-CM

## 2018-05-07 DIAGNOSIS — Z131 Encounter for screening for diabetes mellitus: Secondary | ICD-10-CM | POA: Diagnosis not present

## 2018-05-07 DIAGNOSIS — Z23 Encounter for immunization: Secondary | ICD-10-CM | POA: Diagnosis not present

## 2018-05-07 LAB — POCT GLYCOSYLATED HEMOGLOBIN (HGB A1C): HEMOGLOBIN A1C: 5.8 % — AB (ref 4.0–5.6)

## 2018-05-07 MED ORDER — HYDROXYZINE HCL 50 MG PO TABS: 50.0000 mg | ORAL_TABLET | Freq: Three times a day (TID) | ORAL | 5 refills | Status: AC | PRN

## 2018-05-07 MED ORDER — AMLODIPINE BESYLATE 5 MG PO TABS: 5.0000 mg | ORAL_TABLET | Freq: Every day | ORAL | 5 refills | Status: AC

## 2018-05-07 MED ORDER — TRAZODONE HCL 100 MG PO TABS: 100.0000 mg | ORAL_TABLET | Freq: Every evening | ORAL | 5 refills | Status: AC | PRN

## 2018-05-07 MED ORDER — CITALOPRAM HYDROBROMIDE 20 MG PO TABS: 20.0000 mg | ORAL_TABLET | Freq: Every day | ORAL | 5 refills | Status: AC

## 2018-05-07 NOTE — Patient Instructions (Signed)
[phone] Dr. Wayland DenisEnrico Silva general dentistry   Blood pressure is above goal without medications, will restart amlodipine 5 mg daily.  Return in 1 week for blood pressure check.  Continue low-salt, low-fat diet divided over small meals throughout the day.  Also, increase water intake.  Elevate lower extremities to heart level while at rest.- Continue medication, monitor blood pressure at home. Continue DASH diet. Reminder to go to the ER if any CP, SOB, nausea, dizziness, severe HA, changes vision/speech, left arm numbness and tingling and jaw pain.     We will follow-up by phone with any abnormal laboratory results Will receive tetanus today

## 2018-05-07 NOTE — Progress Notes (Signed)
Subjective:    Patient ID: Allen Travis, male    DOB: 03-23-1956, 62 y.o.   MRN: [id number]  HPI Vanderbilt Ranieri, a 62 year old male with a history of major depressive disorder, hypertension, and hyperlipidemia presents to establish care. Patient has not had a primary provider over the past several years and has primarily been using the emergency department for all healthcare needs. Patient has a history of major depressive disorder. He is not under the care of psychiatric services. He has been unable to obtain medications due to financial constraints. He reports periodic symptoms of anhedonia. He denies suicidal or homicidal ideations. He also denies visual or auditory hallucinations.  Mr. Faucett also has a history of hypertension and hyperlipidemia. Amlodipine was previously initiated, but patient has not been able to continue medication. He does not exercise routinely or follow a lowfat, low sodium diet. Patient denies chest pain, heart palpitations, shortness of breath, fatigue, dizziness, or lower extremity edema. Mr. Whittinghill cardiac risk factors include: hyperlipidemia and poor diet.    Past Medical History:  Diagnosis Date  . Anxiety   . Hypertension    Social History   Socioeconomic History  . Marital status: Single    Spouse name: Not on file  . Number of children: Not on file  . Years of education: Not on file  . Highest education level: Not on file  Occupational History  . Not on file  Social Needs  . Financial resource strain: Not on file  . Food insecurity:    Worry: Not on file    Inability: Not on file  . Transportation needs:    Medical: Not on file    Non-medical: Not on file  Tobacco Use  . Smoking status: Former Games developer  . Smokeless tobacco: Never Used  Substance and Sexual Activity  . Alcohol use: Yes    Comment: daily use  . Drug use: Never  . Sexual activity: Not on file  Lifestyle  . Physical activity:    Days per week: Not on file    Minutes per  session: Not on file  . Stress: Not on file  Relationships  . Social connections:    Talks on phone: Not on file    Gets together: Not on file    Attends religious service: Not on file    Active member of club or organization: Not on file    Attends meetings of clubs or organizations: Not on file    Relationship status: Not on file  . Intimate partner violence:    Fear of current or ex partner: Not on file    Emotionally abused: Not on file    Physically abused: Not on file    Forced sexual activity: Not on file  Other Topics Concern  . Not on file  Social History Narrative  . Not on file    There is no immunization history on file for this patient.  Review of Systems  Constitutional: Negative.   HENT: Negative.   Eyes: Negative for photophobia and redness.  Respiratory: Negative.   Cardiovascular: Negative for chest pain, palpitations and leg swelling.  Gastrointestinal: Negative.   Endocrine: Negative for polyphagia and polyuria.  Musculoskeletal: Negative.   Skin: Negative.   Allergic/Immunologic: Negative.   Neurological: Negative.   Hematological: Negative.   Psychiatric/Behavioral: Negative for sleep disturbance and suicidal ideas. The patient is nervous/anxious.        Depression       Objective:   Physical Exam  Constitutional: He is oriented to person, place, and time. He appears well-developed and well-nourished.  Eyes: Pupils are equal, round, and reactive to light.  Cardiovascular: Normal rate, regular rhythm and normal heart sounds.  Pulmonary/Chest: Effort normal.  Abdominal: Soft. Bowel sounds are normal.  Musculoskeletal: Normal range of motion.  Neurological: He is alert and oriented to person, place, and time.  Skin: Skin is warm and dry.  Psychiatric: His behavior is normal. Judgment and thought content normal. His mood appears anxious. He does not exhibit a depressed mood. He expresses no homicidal and no suicidal ideation.         BP (!)  152/94 (BP Location: Left Arm, Patient Position: Sitting, Cuff Size: Large)   Pulse 83   Temp 98 F (36.7 C) (Oral)   Resp 16   Ht 6\' 7"  (2.007 m)   Wt 231 lb (104.8 kg)   SpO2 97%   BMI 26.02 kg/m  Assessment & Plan:  1. Major depressive disorder, recurrent severe without psychotic features (HCC) - citalopram (CELEXA) 20 MG tablet; Take 1 tablet (20 mg total) by mouth daily.  Dispense: 30 tablet; Refill: 5 - traZODone (DESYREL) 100 MG tablet; Take 1 tablet (100 mg total) by mouth at bedtime as needed for sleep.  Dispense: 30 tablet; Refill: 5 - Ambulatory referral to Psychiatry  2. Generalized anxiety disorder - hydrOXYzine (ATARAX/VISTARIL) 50 MG tablet; Take 1 tablet (50 mg total) by mouth 3 (three) times daily as needed for anxiety.  Dispense: 90 tablet; Refill: 5 - Ambulatory referral to Psychiatry  3. Essential hypertension Blood pressure is above goal. Will start daily CCB. Also, recommend DASH diet. Patient resides in shelter and does not prepare foods. Discussed the importance of making good choices.  Also, discussed the importance of taking medications consistently in order to achieve positive outcomes.  - amLODipine (NORVASC) 5 MG tablet; Take 1 tablet (5 mg total) by mouth daily.  Dispense: 30 tablet; Refill: 5 - Basic Metabolic Panel - Lipid Panel  4. Diabetes mellitus screening  - HgB A1c  5. Need for hepatitis C screening test - Hepatitis C Antibody  6. Screening for HIV (human immunodeficiency virus) - HIV antibody (with reflex)  7. Colon cancer screening - Ambulatory referral to Gastroenterology  8. Need for Tdap vaccination - Tdap vaccine greater than or equal to 7yo IM   RTC: 1 week for blood pressure check. 3 months for hypertension   Nolon NationsLachina Moore Varnell Orvis  MSN, FNP-C Patient Care [facility] [address]  [place], [place] [address] [phone]

## 2018-05-08 ENCOUNTER — Other Ambulatory Visit: Payer: Self-pay | Admitting: Family Medicine

## 2018-05-08 ENCOUNTER — Telehealth: Payer: Self-pay

## 2018-05-08 DIAGNOSIS — E785 Hyperlipidemia, unspecified: Secondary | ICD-10-CM | POA: Insufficient documentation

## 2018-05-08 LAB — BASIC METABOLIC PANEL
BUN/Creatinine Ratio: 9 — ABNORMAL LOW (ref 10–24)
BUN: 11 mg/dL (ref 8–27)
CALCIUM: 10.3 mg/dL — AB (ref 8.6–10.2)
CHLORIDE: 100 mmol/L (ref 96–106)
CO2: 24 mmol/L (ref 20–29)
Creatinine, Ser: 1.24 mg/dL (ref 0.76–1.27)
GFR calc Af Amer: 72 mL/min/{1.73_m2} (ref >59)
GFR calc non Af Amer: 62 mL/min/{1.73_m2} (ref >59)
Glucose: 89 mg/dL (ref 65–99)
Potassium: 3.8 mmol/L (ref 3.5–5.2)
Sodium: 143 mmol/L (ref 134–144)

## 2018-05-08 LAB — LIPID PANEL
CHOLESTEROL TOTAL: 251 mg/dL — AB (ref 100–199)
Chol/HDL Ratio: 3.3 ratio (ref 0.0–5.0)
HDL: 77 mg/dL (ref >39)
LDL Calculated: 156 mg/dL — ABNORMAL HIGH (ref 0–99)
Triglycerides: 90 mg/dL (ref 0–149)
VLDL CHOLESTEROL CAL: 18 mg/dL (ref 5–40)

## 2018-05-08 LAB — HEPATITIS C ANTIBODY: Hep C Virus Ab: <0.1 s/co ratio (ref 0.0–0.9)

## 2018-05-08 LAB — HIV ANTIBODY (ROUTINE TESTING W REFLEX): HIV SCREEN 4TH GENERATION: NONREACTIVE

## 2018-05-08 MED ORDER — ATORVASTATIN CALCIUM 20 MG PO TABS: 20.0000 mg | ORAL_TABLET | Freq: Every day | ORAL | 1 refills | Status: AC

## 2018-05-08 NOTE — Telephone Encounter (Signed)
-----   Message from Massie MaroonLachina M Hollis, [place]NP sent at 05/08/2018  5:54 AM EDT ----- Regarding: lab results Please inform patient that cholesterol is elevated total cholesterol is 251, goal is < 200 and LDL is 156, goal is < 70. Will start atorvastatin 20 mg every evening with dinner. Recommend a lowfat, low carbohydrate diet divided over 5-6 small meals, increase water intake to 6-8 glasses, and 150 minutes per week of cardiovascular exercise.   Advise to follow up in office as scheduled.    Nolon NationsLachina Moore Hollis  MSN, FNP-C Patient Care [facility] [address]  [place], [place] [address] [phone]

## 2018-05-08 NOTE — Telephone Encounter (Signed)
Called and spoke with patient. Advised that cholesterol is elevated and that we are starting him on a dose of cholesterol medication once daily with dinner. Advised that he should eat a low fat low carb diet and exercise 150 minutes weekly. Patient verbalized understanding. Thanks!

## 2018-05-08 NOTE — Progress Notes (Signed)
Meds ordered this encounter  Medications  . atorvastatin (LIPITOR) 20 MG tablet    Sig: Take 1 tablet (20 mg total) by mouth daily.    Dispense:  90 tablet    Refill:  1   Samarra Ridgely Moore Candice Lunney  MSN, FNP-C [facility] [address]  [place], [place] [address] [phone]  

## 2018-05-09 ENCOUNTER — Inpatient Hospital Stay: Payer: Self-pay

## 2018-05-09 NOTE — Progress Notes (Deleted)
Patient ID: Allen SilenceCharles Travis, male   DOB: 1956-03-20, 62 y.o.   MRN: [id number] 62 y.o. male here for difficulty obtaining medications. States he was discharged from hospital with prescriptions but has been unable to obtain them. He was homeless but now has housing, states he does not know where he can go to obtain his medications at a good price. He states he has no money. He is particularly concerned about his blood pressure and "stress" medicines.  He was admitted for SI and discharged on 4/18. He has his prescriptions for celexa, norvasc, trazadone, vistaril, neurontin and motrin with him.  He denies CP, SOB, headache, abdominal pain, SI, HI, AVH

## 2018-05-15 MED FILL — CITALOPRAM HBR 20 MG TABLET: 20 | 30 days supply | Qty: 30 | Fill #0

## 2018-05-15 MED FILL — traZODone HCL 100 MG TABS: 100 | 30 days supply | Qty: 30 | Fill #0

## 2018-05-15 MED FILL — ATORVASTATIN 20 MG TABLET: 20 | 30 days supply | Qty: 30 | Fill #0

## 2018-05-15 MED FILL — GABAPENTIN 300 MG CAPSULE: 300 | 30 days supply | Qty: 90 | Fill #0

## 2018-05-15 MED FILL — PANTOPRAZOLE SOD DR 40 MG T: 40 | 30 days supply | Qty: 30 | Fill #0

## 2018-05-15 MED FILL — hydrOXYzine HCL 50 MG TABS: 50 | 30 days supply | Qty: 90 | Fill #0

## 2018-05-15 MED FILL — IBUPROFEN 600 MG TABLET: 600 | 7 days supply | Qty: 30 | Fill #0

## 2018-05-15 MED FILL — AMLODIPINE BESYLATE 5 MG TA: 5 | 30 days supply | Qty: 30 | Fill #0

## 2018-07-10 ENCOUNTER — Encounter: Payer: Self-pay | Admitting: Family Medicine

## 2018-08-08 ENCOUNTER — Ambulatory Visit: Payer: Self-pay | Admitting: Family Medicine

## 2019-07-24 ENCOUNTER — Encounter (HOSPITAL_COMMUNITY): Payer: Self-pay

## 2019-07-24 ENCOUNTER — Encounter (HOSPITAL_COMMUNITY): Payer: Self-pay | Admitting: *Deleted

## 2019-12-24 IMAGING — CR DG FOOT COMPLETE 3+V*R*
3 series · 3 of 3 positions shown · non-contrast
Comparison: None.

CLINICAL DATA: Persistent right foot pain following a fracture 3
months ago. Unable to bear weight. Initial encounter.

EXAM:
RIGHT FOOT COMPLETE - 3+ VIEW

[x foot ap right]
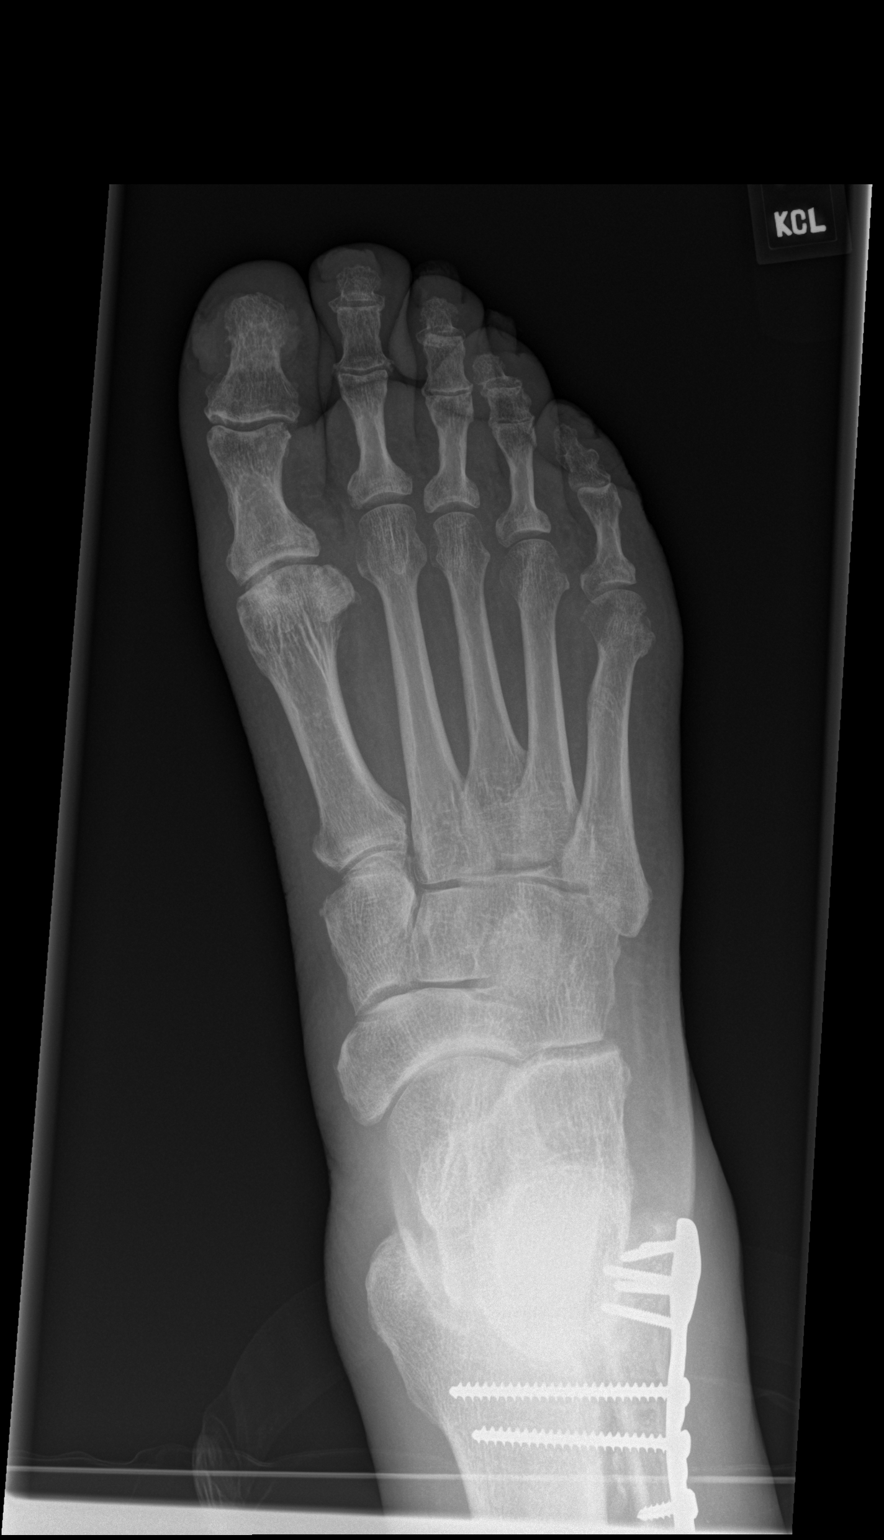

[x foot obl right]
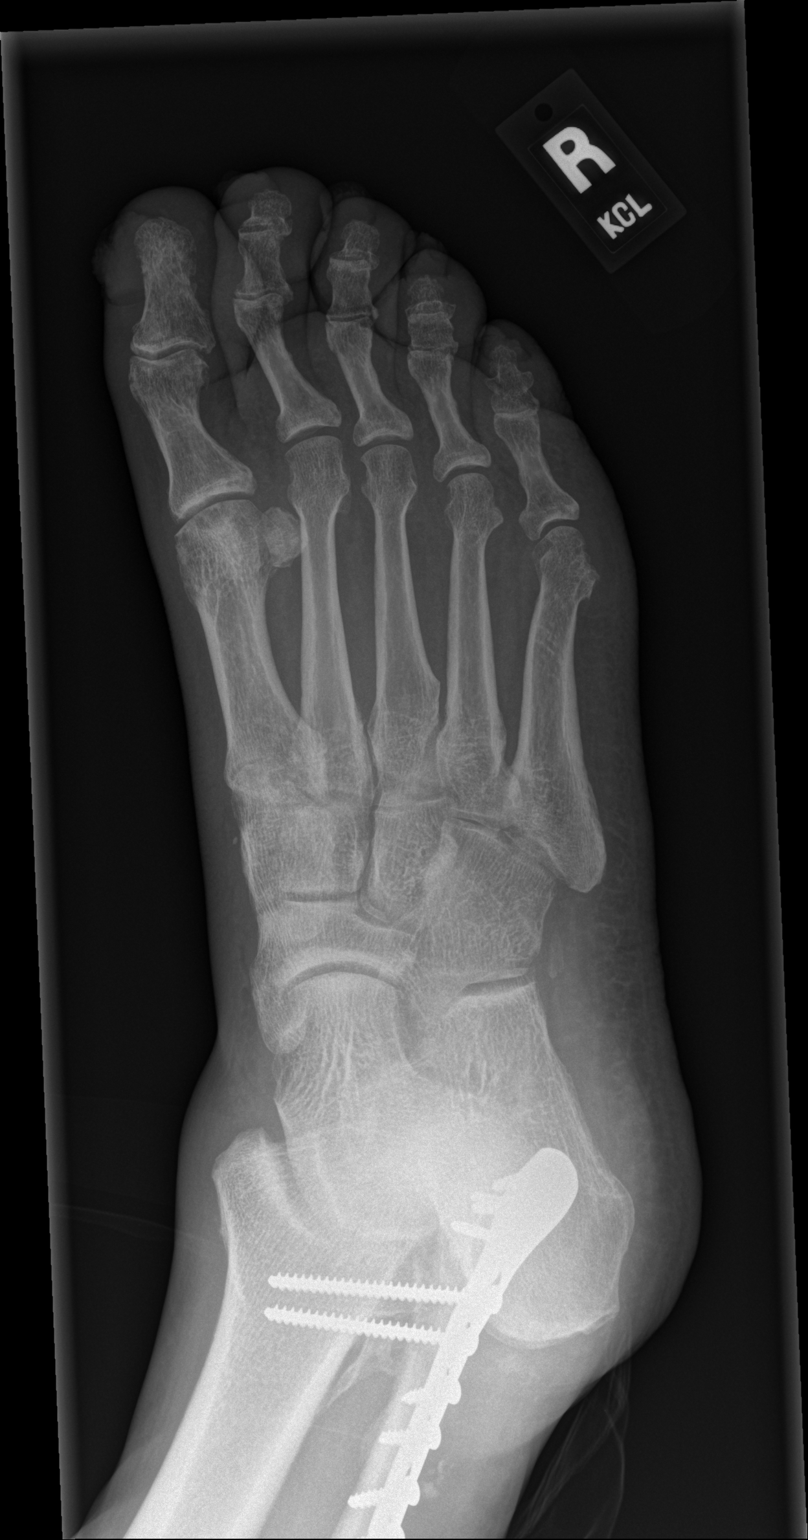

[x foot lat right]
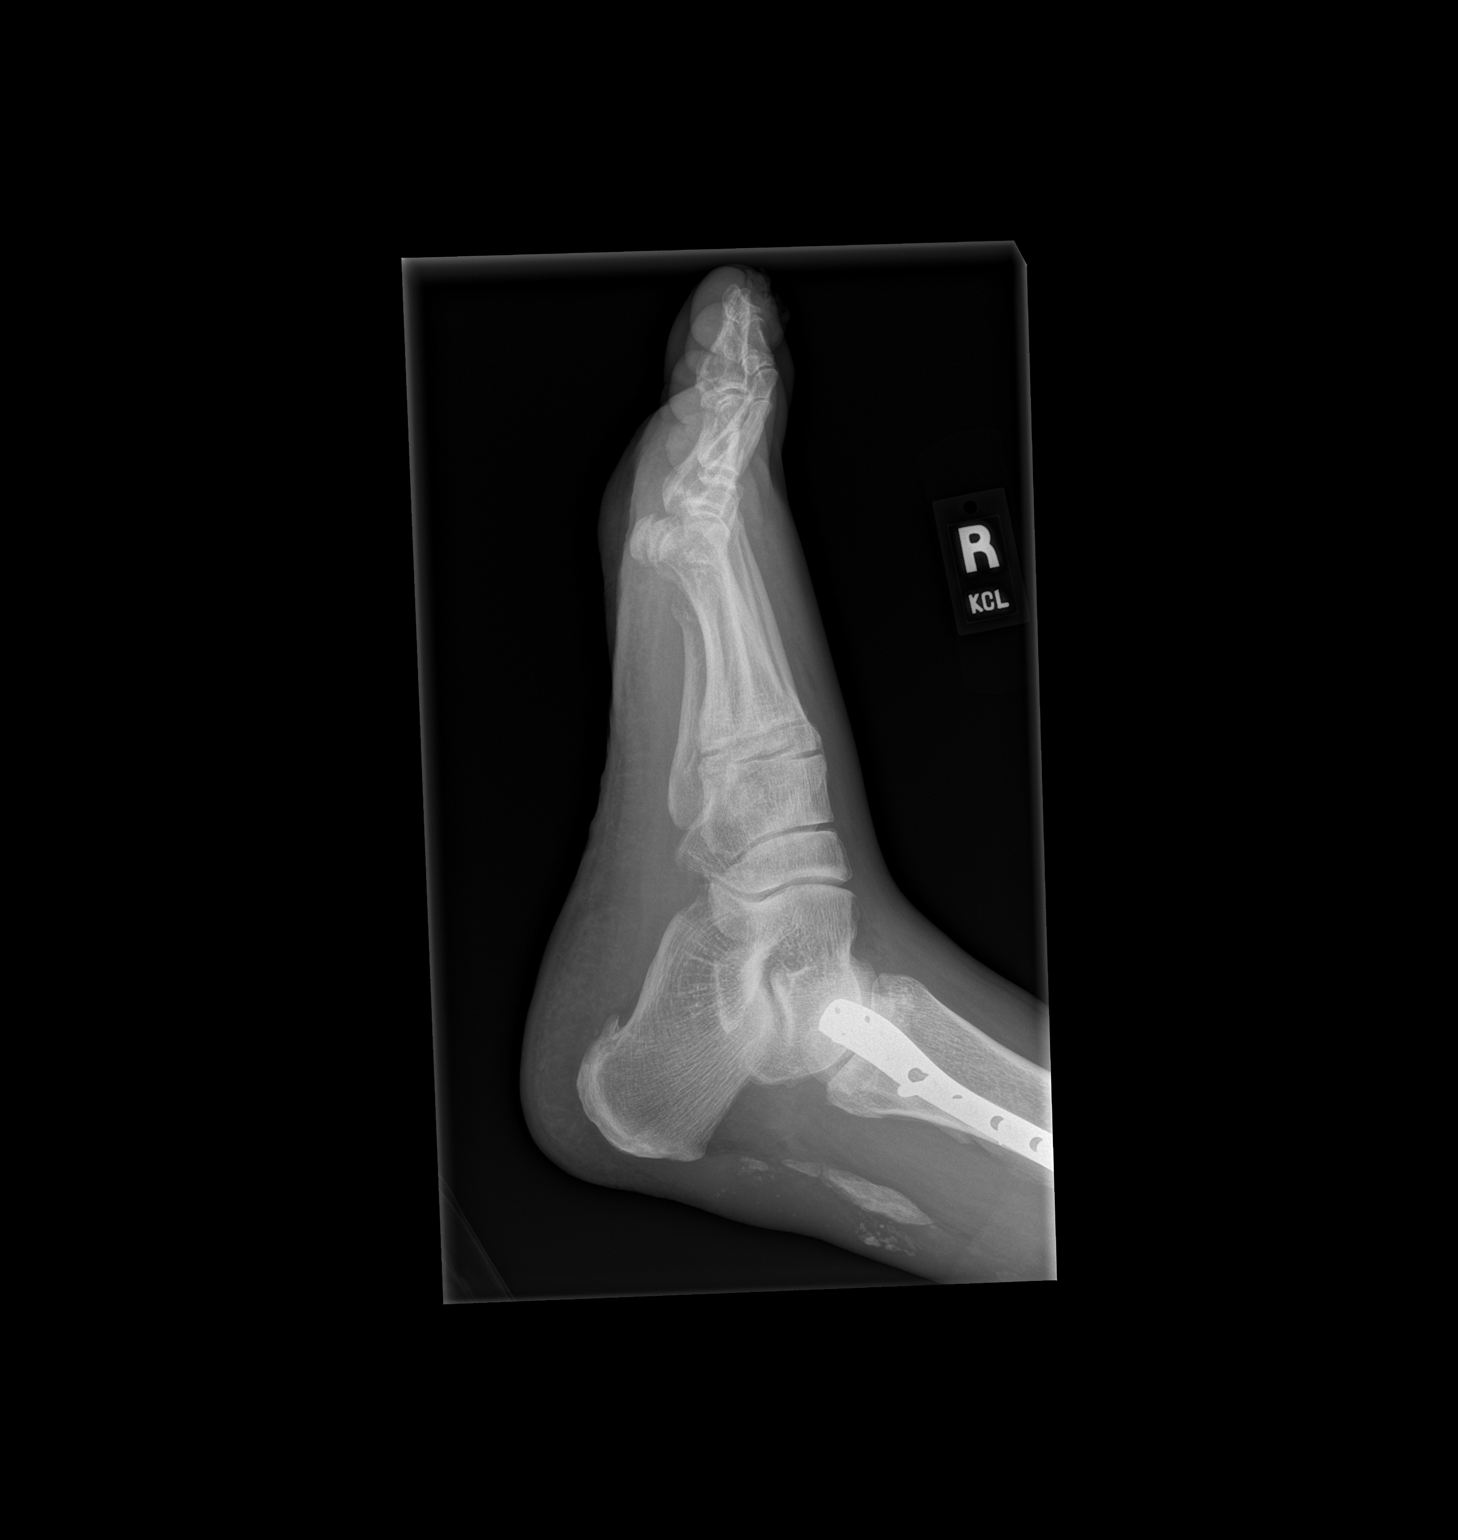

[3 of 3 positions shown; findings below may reference images not displayed]

FINDINGS: Sequelae of plate and screw fixation of a distal fibular fracture
are partially visualized. Screws also extend across the distal
tibiofibular syndesmosis. There is heterotopic ossification
posterior to the ankle. The bones of the foot are osteopenic without
acute fracture or dislocation identified. Mild degenerative changes
are noted in multiple IP joints. A small plantar calcaneal
enthesophyte is present. There is mild soft tissue swelling along
the dorsum of the foot.
IMPRESSION: 1. No acute osseous abnormality identified in the foot.
2. Prior ORIF of a distal fibular fracture, incompletely evaluated.

## 2019-12-25 IMAGING — CR DG CHEST 2V
3 series · 3 of 3 positions shown · non-contrast
Comparison: None.

CLINICAL DATA: 61 y/o M; evaluate for medical clearance, nonsmoker,
no other chest complaints. History of hypertension.

EXAM:
CHEST - 2 VIEW

[x chest ap]
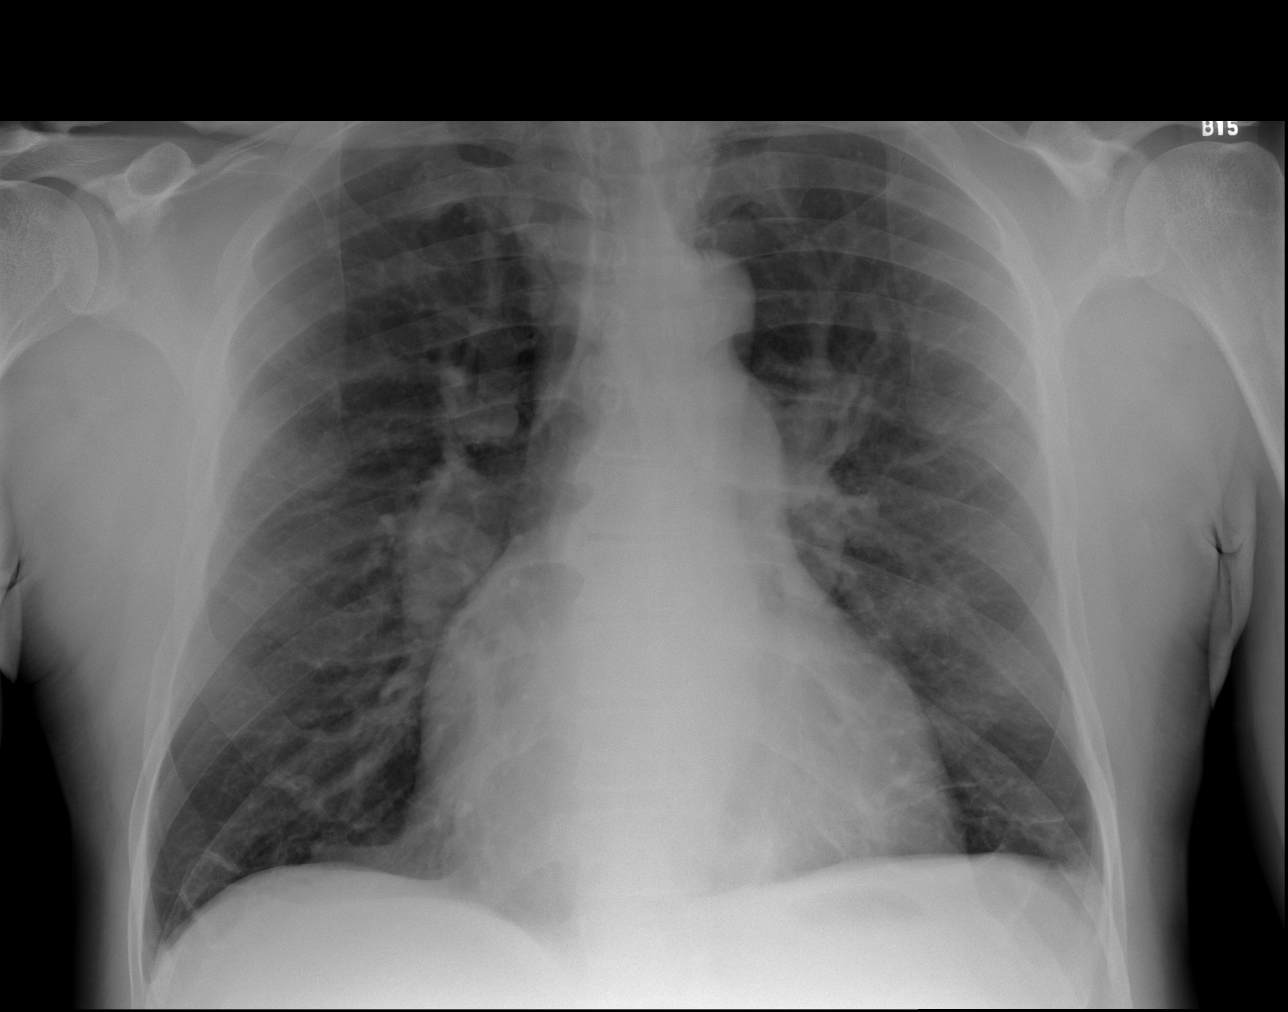

[w chest lat (1 of 2)]
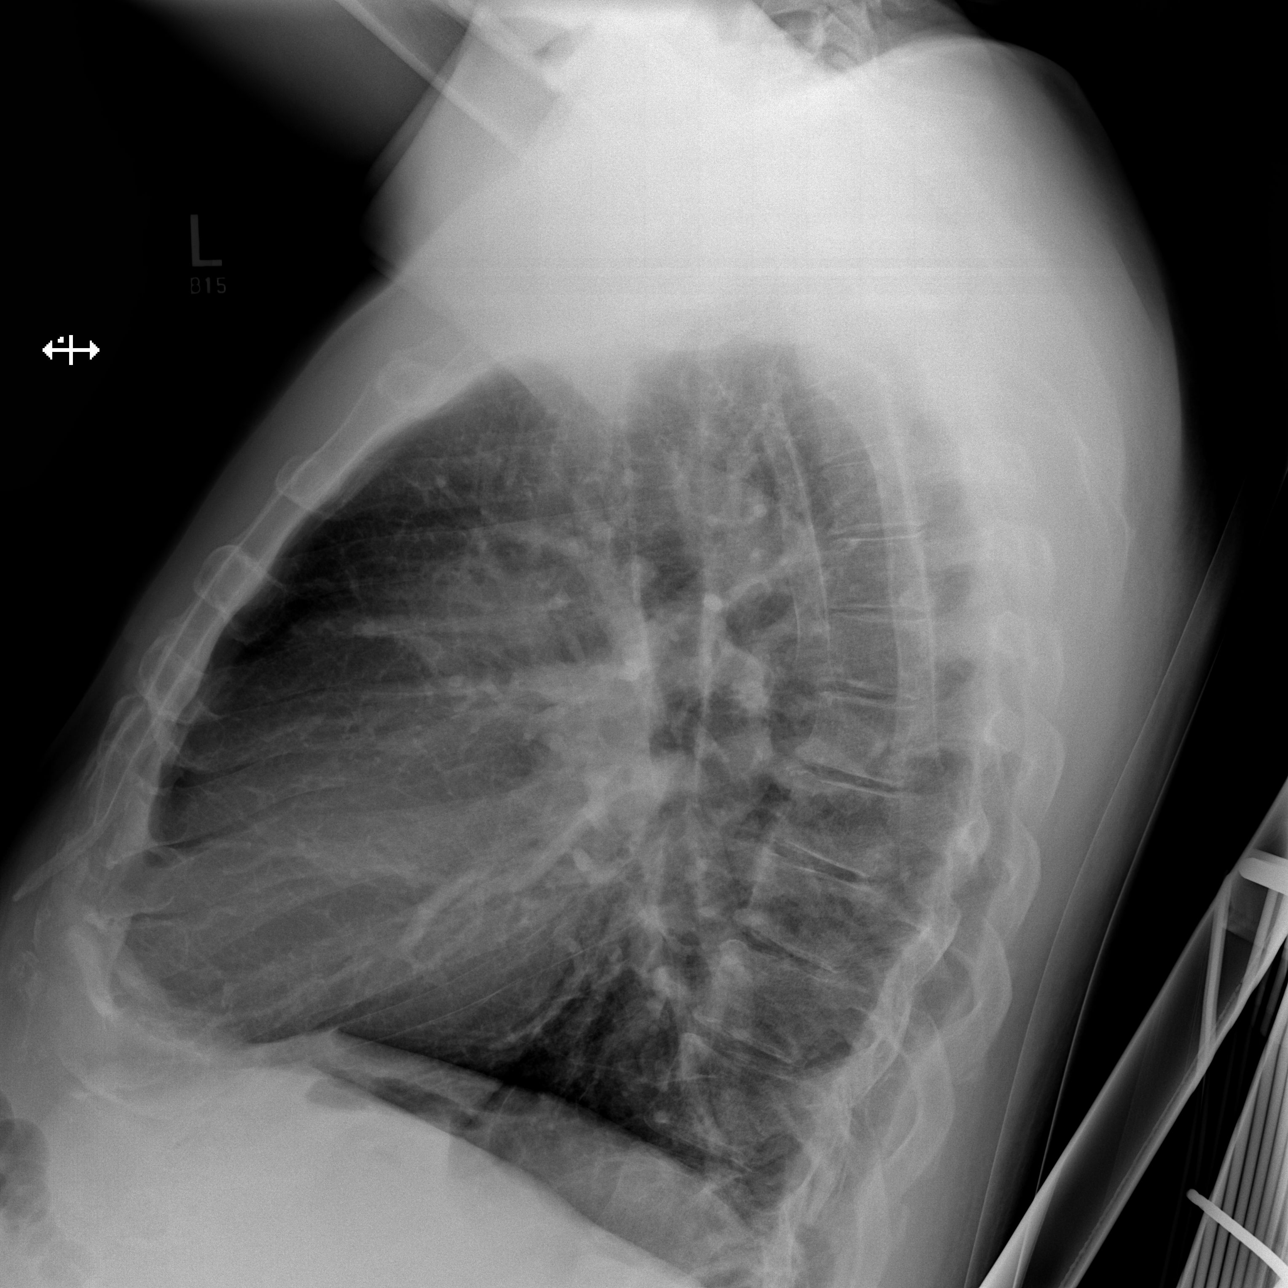

[w chest lat (2 of 2)]
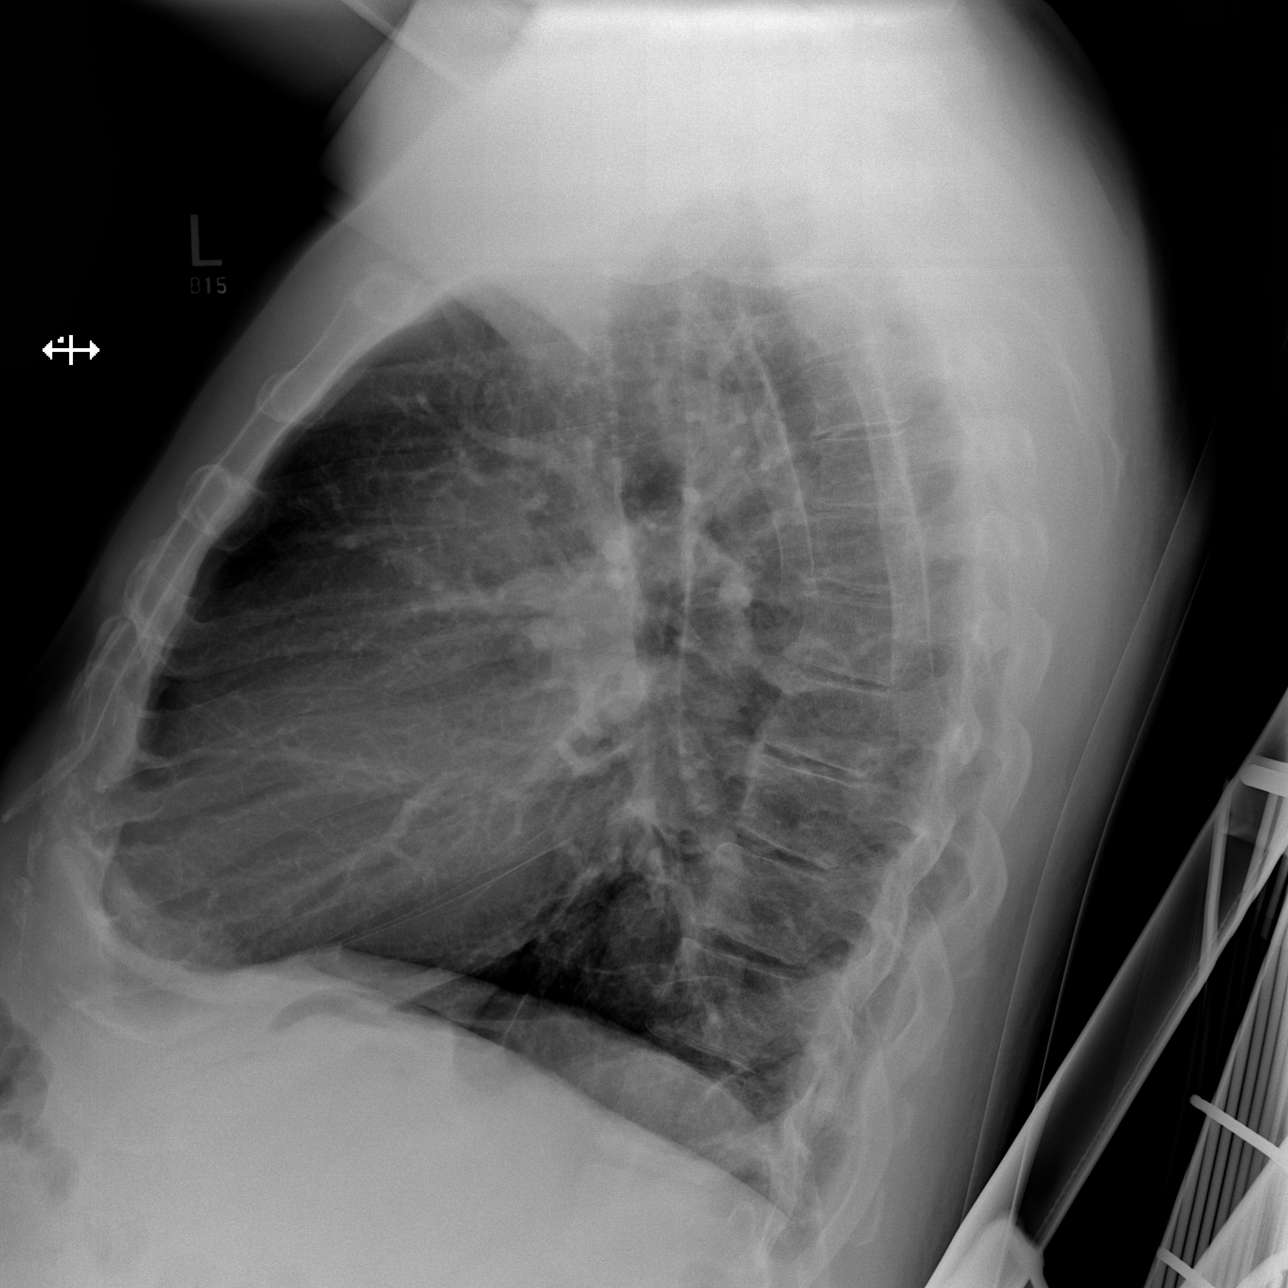

[3 of 3 positions shown; findings below may reference images not displayed]

FINDINGS: Normal cardiac silhouette given projection and technique. Enlarged
central pulmonary arteries may represent pulmonary artery
hypertension. No consolidation, effusion, or pneumothorax. No acute
osseous abnormality is evident.
IMPRESSION: No acute pulmonary process. Enlarged central pulmonary arteries may
represent pulmonary artery hypertension.

By: Yordanis Dulaney M.D.

## 2023-08-08 ENCOUNTER — Encounter: Payer: Self-pay | Admitting: *Deleted
